# Patient Record
Sex: Male | Born: 1965 | Race: Black or African American | Hispanic: No | Marital: Married | Smoking: Former smoker
Health system: Southern US, Community
[De-identification: ages and names within clinical notes are randomized; demographics above are authoritative.]

## PROBLEM LIST (undated history)

## (undated) DIAGNOSIS — I10 Essential (primary) hypertension: Secondary | ICD-10-CM

## (undated) DIAGNOSIS — R55 Syncope and collapse: Secondary | ICD-10-CM

## (undated) DIAGNOSIS — F419 Anxiety disorder, unspecified: Secondary | ICD-10-CM

## (undated) DIAGNOSIS — E079 Disorder of thyroid, unspecified: Secondary | ICD-10-CM

## (undated) HISTORY — DX: Syncope and collapse: R55

## (undated) HISTORY — PX: NO PAST SURGERIES: SHX2092

---

## 2001-12-04 ENCOUNTER — Encounter: Admission: RE | Admit: 2001-12-04 | Discharge: 2001-12-04 | Payer: Self-pay | Admitting: Family Medicine

## 2003-04-24 ENCOUNTER — Encounter: Admission: RE | Admit: 2003-04-24 | Discharge: 2003-04-24 | Payer: Self-pay | Admitting: Family Medicine

## 2009-11-17 DIAGNOSIS — R55 Syncope and collapse: Secondary | ICD-10-CM

## 2009-11-17 HISTORY — DX: Syncope and collapse: R55

## 2010-01-05 ENCOUNTER — Encounter: Payer: Self-pay | Admitting: Internal Medicine

## 2010-01-05 ENCOUNTER — Ambulatory Visit: Payer: Self-pay | Admitting: Internal Medicine

## 2010-01-05 DIAGNOSIS — R030 Elevated blood-pressure reading, without diagnosis of hypertension: Secondary | ICD-10-CM

## 2010-01-07 LAB — CONVERTED CEMR LAB
ALT: 21 units/L (ref 0–53)
AST: 32 units/L (ref 0–37)
Albumin: 4.3 g/dL (ref 3.5–5.2)
Alkaline Phosphatase: 45 units/L (ref 39–117)
BUN: 11 mg/dL (ref 6–23)
Basophils Absolute: 0 10*3/uL (ref 0.0–0.1)
Basophils Relative: 0.9 % (ref 0.0–3.0)
Bilirubin, Direct: 0.1 mg/dL (ref 0.0–0.3)
CO2: 27 meq/L (ref 19–32)
Calcium: 9.4 mg/dL (ref 8.4–10.5)
Chloride: 102 meq/L (ref 96–112)
Cholesterol: 188 mg/dL (ref 0–200)
Creatinine, Ser: 0.8 mg/dL (ref 0.4–1.5)
Eosinophils Absolute: 0.1 10*3/uL (ref 0.0–0.7)
Eosinophils Relative: 1.4 % (ref 0.0–5.0)
GFR calc non Af Amer: 135.73 mL/min (ref >60.00)
Glucose, Bld: 76 mg/dL (ref 70–99)
HCT: 40.3 % (ref 39.0–52.0)
HDL: 88.6 mg/dL (ref >39.00)
Hemoglobin: 13.8 g/dL (ref 13.0–17.0)
LDL Cholesterol: 83 mg/dL (ref 0–99)
Lymphocytes Relative: 36.9 % (ref 12.0–46.0)
Lymphs Abs: 1.3 10*3/uL (ref 0.7–4.0)
MCHC: 34.2 g/dL (ref 30.0–36.0)
MCV: 86.4 fL (ref 78.0–100.0)
Monocytes Absolute: 0.3 10*3/uL (ref 0.1–1.0)
Monocytes Relative: 9.3 % (ref 3.0–12.0)
Neutro Abs: 1.9 10*3/uL (ref 1.4–7.7)
Neutrophils Relative %: 51.5 % (ref 43.0–77.0)
Platelets: 246 10*3/uL (ref 150.0–400.0)
Potassium: 4.1 meq/L (ref 3.5–5.1)
RBC: 4.67 M/uL (ref 4.22–5.81)
RDW: 13.8 % (ref 11.5–14.6)
Sodium: 138 meq/L (ref 135–145)
TSH: 0.52 microintl units/mL (ref 0.35–5.50)
Total Bilirubin: 0.5 mg/dL (ref 0.3–1.2)
Total CHOL/HDL Ratio: 2
Total Protein: 7.5 g/dL (ref 6.0–8.3)
Triglycerides: 80 mg/dL (ref 0.0–149.0)
VLDL: 16 mg/dL (ref 0.0–40.0)
WBC: 3.6 10*3/uL — ABNORMAL LOW (ref 4.5–10.5)

## 2010-01-14 ENCOUNTER — Ambulatory Visit (HOSPITAL_COMMUNITY): Admission: RE | Admit: 2010-01-14 | Payer: Self-pay | Source: Home / Self Care | Admitting: Internal Medicine

## 2010-01-14 ENCOUNTER — Ambulatory Visit: Payer: Self-pay

## 2010-01-15 ENCOUNTER — Encounter: Payer: Self-pay | Admitting: Internal Medicine

## 2010-01-19 ENCOUNTER — Ambulatory Visit: Admission: RE | Admit: 2010-01-19 | Payer: Self-pay | Source: Home / Self Care | Admitting: Internal Medicine

## 2010-01-19 ENCOUNTER — Encounter: Payer: Self-pay | Admitting: Internal Medicine

## 2010-01-20 ENCOUNTER — Ambulatory Visit (HOSPITAL_COMMUNITY)
Admission: RE | Admit: 2010-01-20 | Discharge: 2010-01-20 | Payer: Self-pay | Source: Home / Self Care | Attending: Internal Medicine | Admitting: Internal Medicine

## 2010-01-20 ENCOUNTER — Other Ambulatory Visit: Payer: Self-pay | Admitting: Internal Medicine

## 2010-01-27 ENCOUNTER — Encounter (INDEPENDENT_AMBULATORY_CARE_PROVIDER_SITE_OTHER): Payer: Self-pay | Admitting: *Deleted

## 2010-02-12 ENCOUNTER — Ambulatory Visit: Admit: 2010-02-12 | Payer: Self-pay | Admitting: Internal Medicine

## 2010-02-18 NOTE — Assessment & Plan Note (Signed)
Summary: bp check///sph    Nurse Visit   Vital Signs:  Patient profile:   45 year old male BP sitting:   142 / 82  (left arm) Cuff size:   large CC: BP check./kb Comments Patient was advised that his BP is better than before, but still not ideal. He is aware I will call him at home with MD recommendations. Lucious Groves CMA  January 19, 2010 9:20 AM    Allergies: No Known Drug Allergies  Impression & Recommendations:  Problem # 1:  ELEVATED BP READING WITHOUT DX HYPERTENSION (ICD-796.2)  advised patient BP today---better I recommend him to continue watching his salt intake, exercise daily. Please come back in 4 weeks for another  nurse visit and BP check  He was recommended an  echocardiogram...Marland KitchenMarland Kitchen. done?   BP today: 142/82 Prior BP: 160/90 (01/05/2010)  Labs Reviewed: Creat: 0.8 (01/05/2010) Chol: 188 (01/05/2010)   HDL: 88.60 (01/05/2010)   LDL: 83 (01/05/2010)   TG: 80.0 (01/05/2010)  Instructed in low sodium diet (DASH Handout) and behavior modification.    Orders: No Charge Patient Arrived (NCPA0) (NCPA0)   Orders Added: 1)  No Charge Patient Arrived (NCPA0) [NCPA0] I left message with family member at patient home to have him call the office. I tried work Chemical engineer notified me that she did not know who the patient was. Lucious Groves CMA  January 20, 2010 11:04 AM   No return call from the patient, tried work # again and they cannot connect me to the patient b/c they do not know who he is nor his department. I spoke with patient spouse and notified her that I cannot get in touch with the patient. She will have him call our office tomorrow. Lucious Groves CMA  January 21, 2010 11:35 AM   Patient notified of the above and states that he had the echo done yesterday. Lucious Groves CMA  January 22, 2010 11:44 AM

## 2010-02-18 NOTE — Assessment & Plan Note (Signed)
Summary: NEW TO EST/KN   Vital Signs:  Patient profile:   45 year old male Height:      68.5 inches Weight:      126 pounds BMI:     18.95 Pulse rate:   85 / minute Pulse rhythm:   regular BP sitting:   160 / 90  (left arm) Cuff size:   regular  Vitals Entered By: Army Fossa CMA (January 05, 2010 9:20 AM) CC: New to establish-CPX- fasting  Comments high blood pressur readings declines Td, declines flu fainted 1 month ago [facility] [address]   History of Present Illness: new patient CPX  --BP elevated a month ago when he had a check up for foster care . No h/o HTN prior to that  --a month ago, was talking with a friend in the parking lot , he suddenly felt weak and almost fainted. His friend helped him into his apartment, symptoms resolved within a minute. this was a normal day, he was not particularly tired, he was not drinking. No actual loss of consciousness There was no associated chest pain, shortness of breath, palpitations, headaches, diplopia or slurred speech. His vision got slightly blurred. this was the first episode he ever had, no further episodes  Preventive Screening-Counseling & Management  Alcohol-Tobacco     Alcohol drinks/day: 2     Alcohol type: beer     Smoking Status: never  Caffeine-Diet-Exercise     Does Patient Exercise: no      Drug Use:  no.    Current Medications (verified): 1)  None  Allergies (verified): No Known Drug Allergies  Past History:  Social History: Last updated: 01/05/2010 Married children x 3 biological, 4 step children occupation [employer] Never Smoked Alcohol use-yes Drug use-no Regular exercise-no  Risk Factors: Alcohol Use: 2 (01/05/2010) Exercise: no (01/05/2010)  Risk Factors: Smoking Status: never (01/05/2010)  Past Medical History: Heart Murmur ?  Past Surgical History: no major surgeries   Family History: colon ca--no breast ca--no prostate ca--no MI--no DM--no  Social  History: Married children x 3 biological, 4 step children occupation [employer] Never Smoked Alcohol use-yes Drug use-no Regular exercise-no Smoking Status:  never Drug Use:  no Does Patient Exercise:  no  Review of Systems General:  Denies fatigue, fever, and weight loss. CV:  Denies swelling of feet. Resp:  Denies cough, shortness of breath, and wheezing. GI:  Denies bloody stools, diarrhea, nausea, and vomiting. GU:  Denies dysuria, hematuria, urinary frequency, and urinary hesitancy. Psych:  Denies anxiety and depression.  Physical Exam  General:  alert, well-developed, and well-nourished.   Neck:  no masses, no thyromegaly, and normal carotid upstroke.   Lungs:  normal respiratory effort, no intercostal retractions, no accessory muscle use, and normal breath sounds.   Heart:  normal rate, regular rhythm, no murmur, and no gallop.   Abdomen:  soft, non-tender, no distention, no masses, no guarding, and no rigidity.   Extremities:  no pretibial edema bilaterally  Psych:  Oriented X3, memory intact for recent and remote, normally interactive, good eye contact, not anxious appearing, and not depressed appearing.     Impression & Recommendations:  Problem # 1:  ROUTINE GENERAL MEDICAL EXAM@HEALTH  CARE FACL (ICD-V70.0) Td-- last?  declined flu shot-- declined  explained the benefits of immunization  labs Diet and exercise discussed  Orders: Venipuncture (14782) TLB-BMP (Basic Metabolic Panel-BMET) (80048-METABOL) TLB-CBC Platelet - w/Differential (85025-CBCD) TLB-Hepatic/Liver Function Pnl (80076-HEPATIC) TLB-Lipid Panel (80061-LIPID) TLB-TSH (Thyroid Stimulating Hormone) (84443-TSH)  Specimen Handling (04540)  Problem # 2:  ELEVATED BP READING WITHOUT DX HYPERTENSION (ICD-796.2) no history of hypertension BP was elevated a month ago and today Plan: low salt diet, return to the office in 2 weeks for BP check, no charge  if the BP is still elevated will start  medication.  BP today: 160/90     Problem # 3:  ? of SYNCOPE (ICD-780.2) EKG normal sinus rhythm Physical exam normal, no associated symptoms with syncope. plan---Echocardiogram if the echocardiogram is normal we'll prescribe observation  patient knows to call me if he has another spell.  Orders: EKG w/ Interpretation (93000) Cardiology Referral (Cardiology)  Patient Instructions: 1)  low salt diet 2)  Come back in 2 weeks for a BP check   Orders Added: 1)  Venipuncture [36415] 2)  TLB-BMP (Basic Metabolic Panel-BMET) [80048-METABOL] 3)  TLB-CBC Platelet - w/Differential [85025-CBCD] 4)  TLB-Hepatic/Liver Function Pnl [80076-HEPATIC] 5)  TLB-Lipid Panel [80061-LIPID] 6)  TLB-TSH (Thyroid Stimulating Hormone) [84443-TSH] 7)  EKG w/ Interpretation [93000] 8)  Specimen Handling [99000] 9)  New Patient Level III [98119] 10)  Cardiology Referral [Cardiology] 11)  New Patient 40-64 years [99386]     Risk Factors:  Tobacco use:  never Drug use:  no Alcohol use:  yes    Type:  beer    Drinks per day:  2 Exercise:  no

## 2010-02-18 NOTE — Letter (Signed)
Summary: Unable To Reach-Consult Scheduled  [facility]  [address] [place], [place] [address]   Phone: [phone]  Fax: [phone]    01/27/2010 MRN: [id number]    Dear Hunter Mitchell,   We have been unable to reach you by phone.  Please contact our office with an updated phone number. Enclosed is a copy of your recent ECHO.    Thank you,  Army Fossa CMA  January 27, 2010 2:33 PM

## 2010-02-22 DIAGNOSIS — Z0279 Encounter for issue of other medical certificate: Secondary | ICD-10-CM

## 2010-03-10 NOTE — Letter (Signed)
Summary: Medical Evaluation for [facility]  Medical Evaluation for [facility]   Imported By: Maryln Gottron 03/02/2010 12:46:24  _____________________________________________________________________  External Attachment:    Type:   Image     Comment:   External Document

## 2010-08-04 ENCOUNTER — Encounter: Payer: Self-pay | Admitting: Internal Medicine

## 2010-08-05 ENCOUNTER — Encounter: Payer: Self-pay | Admitting: Internal Medicine

## 2010-08-05 ENCOUNTER — Ambulatory Visit (INDEPENDENT_AMBULATORY_CARE_PROVIDER_SITE_OTHER): Payer: Managed Care, Other (non HMO) | Admitting: Internal Medicine

## 2010-08-05 DIAGNOSIS — R5383 Other fatigue: Secondary | ICD-10-CM | POA: Insufficient documentation

## 2010-08-05 DIAGNOSIS — R6882 Decreased libido: Secondary | ICD-10-CM

## 2010-08-05 DIAGNOSIS — R5381 Other malaise: Secondary | ICD-10-CM

## 2010-08-05 LAB — CBC WITH DIFFERENTIAL/PLATELET
Basophils Absolute: 0 10*3/uL (ref 0.0–0.1)
Basophils Relative: 0.7 % (ref 0.0–3.0)
Eosinophils Absolute: 0.1 10*3/uL (ref 0.0–0.7)
Eosinophils Relative: 2.2 % (ref 0.0–5.0)
HCT: 42.3 % (ref 39.0–52.0)
Hemoglobin: 14.2 g/dL (ref 13.0–17.0)
Lymphocytes Relative: 39.4 % (ref 12.0–46.0)
Lymphs Abs: 1.3 10*3/uL (ref 0.7–4.0)
MCHC: 33.7 g/dL (ref 30.0–36.0)
MCV: 87.1 fl (ref 78.0–100.0)
Monocytes Absolute: 0.4 10*3/uL (ref 0.1–1.0)
Monocytes Relative: 13.4 % — ABNORMAL HIGH (ref 3.0–12.0)
Neutro Abs: 1.4 10*3/uL (ref 1.4–7.7)
Neutrophils Relative %: 44.3 % (ref 43.0–77.0)
Platelets: 220 10*3/uL (ref 150.0–400.0)
RBC: 4.86 Mil/uL (ref 4.22–5.81)
RDW: 13.8 % (ref 11.5–14.6)
WBC: 3.3 10*3/uL — ABNORMAL LOW (ref 4.5–10.5)

## 2010-08-05 LAB — ALT: ALT: 19 U/L (ref 0–53)

## 2010-08-05 LAB — TSH: TSH: 0.23 u[IU]/mL — ABNORMAL LOW (ref 0.35–5.50)

## 2010-08-05 LAB — AST: AST: 30 U/L (ref 0–37)

## 2010-08-05 NOTE — Assessment & Plan Note (Signed)
See above, check a testosterone level

## 2010-08-05 NOTE — Assessment & Plan Note (Addendum)
1 month h/o decreased energy, decreased libido, some ED ROS essentially neg, physical exam wnl, ECHO 6 months ago normal. Plan: Labs, reasses in 4 weeks May need to be checked for OSA although he does not fit the typical OSA profile

## 2010-08-05 NOTE — Progress Notes (Signed)
  Subjective:    Patient ID: Hunter Mitchell, male    DOB: 18-Oct-1965, 45 y.o.   MRN: [id number]  HPI  One month history of decreased energy described as generalized fatigue along with decreased sex drive. In a couple of occasions he has noted difficulty with erections. Used to work 2 jobs up to a couple of years ago and felt great and now he is tired despite the fact that he is working one job.  Past Medical History  Diagnosis Date  . Syncope 11-2009    ECHO 01-2010 normal   No past surgical history on file.   Family History: colon ca--no breast ca--no prostate ca--no MI--no DM--no  Social History: Married children x 3 biological, 4 step children occupation [employer] Never Smoked Alcohol use-yes Drug use-no Regular exercise-no       Review of Systems Denies chest pain, shortness of breath, dyspnea on exertion or extremity edema. No recent weight loss or headaches No nausea, vomiting, diarrhea or blood in the tools Denies depression. He snores occasionally and rarely feels sleepy during the daytime. Denies any hot flashes testicular problems.    Objective:   Physical Exam  Constitutional: He appears well-developed. No distress.       slt underweight , BMI 19  HENT:  Head: Normocephalic and atraumatic.  Neck: No thyromegaly present.  Cardiovascular: Normal rate, regular rhythm and normal heart sounds.   No murmur heard. Pulmonary/Chest: Effort normal and breath sounds normal. No respiratory distress. He has no wheezes. He has no rales.  Abdominal: Soft. Bowel sounds are normal. He exhibits no distension. There is no tenderness. There is no rebound and no guarding.  Musculoskeletal: He exhibits no edema.  Neurological: He is alert.  Skin: Skin is warm and dry.  Psychiatric: He has a normal mood and affect. His behavior is normal. Judgment and thought content normal.          Assessment & Plan:

## 2010-08-06 LAB — TESTOSTERONE, FREE, TOTAL, SHBG
Sex Hormone Binding: 32 nmol/L (ref 13–71)
Testosterone, Free: 119.1 pg/mL (ref 47.0–244.0)
Testosterone-% Free: 2.2 % (ref 1.6–2.9)
Testosterone: 549.22 ng/dL (ref 250–890)

## 2010-08-10 ENCOUNTER — Telehealth: Payer: Self-pay | Admitting: *Deleted

## 2010-08-10 NOTE — Telephone Encounter (Signed)
Message left for patient to return my call.  

## 2010-08-10 NOTE — Telephone Encounter (Signed)
Message copied by Leanne Lovely on Tue Aug 10, 2010  1:15 PM ------      Message from: Willow Ora E      Created: Mon Aug 09, 2010  6:06 PM       Advise patient      CBC normal, no anemia.      Testosterone level normal.      Thyroid gland may be slightly over-working: I am planning to recheck his TSH, T3 and T4 when he comes back in one month.

## 2010-08-11 NOTE — Telephone Encounter (Signed)
Message left for patient to return my call.  

## 2010-08-12 NOTE — Telephone Encounter (Signed)
Message left for patient to return my call.  

## 2010-08-13 ENCOUNTER — Encounter: Payer: Self-pay | Admitting: *Deleted

## 2010-08-13 NOTE — Telephone Encounter (Signed)
Will mail pt labs and letter.

## 2010-09-29 ENCOUNTER — Telehealth: Payer: Self-pay | Admitting: Internal Medicine

## 2010-09-29 DIAGNOSIS — E0590 Thyrotoxicosis, unspecified without thyrotoxic crisis or storm: Secondary | ICD-10-CM

## 2010-09-29 NOTE — Telephone Encounter (Signed)
Advise patient: Needs: TSH, free T3 and free T4 --dx hyperthyroidism

## 2010-09-30 NOTE — Telephone Encounter (Signed)
Left message for pt to call office to schedule lab visit. Orders placed

## 2013-02-04 ENCOUNTER — Encounter: Payer: Self-pay | Admitting: Internal Medicine

## 2013-02-12 ENCOUNTER — Telehealth: Payer: Self-pay

## 2013-02-12 NOTE — Telephone Encounter (Signed)
Patient was cancelled due to provider

## 2013-02-13 ENCOUNTER — Encounter: Payer: Managed Care, Other (non HMO) | Admitting: Internal Medicine

## 2013-03-06 ENCOUNTER — Encounter: Payer: Managed Care, Other (non HMO) | Admitting: Internal Medicine

## 2013-04-11 ENCOUNTER — Encounter: Payer: Managed Care, Other (non HMO) | Admitting: Internal Medicine

## 2013-06-16 ENCOUNTER — Encounter (HOSPITAL_COMMUNITY): Payer: Self-pay | Admitting: Emergency Medicine

## 2013-06-16 ENCOUNTER — Emergency Department (HOSPITAL_COMMUNITY)
Admission: EM | Admit: 2013-06-16 | Discharge: 2013-06-16 | Disposition: A | Discharge status: Home / Self Care | Payer: Managed Care, Other (non HMO) | Attending: Emergency Medicine | Admitting: Emergency Medicine

## 2013-06-16 DIAGNOSIS — S0180XA Unspecified open wound of other part of head, initial encounter: Secondary | ICD-10-CM | POA: Insufficient documentation

## 2013-06-16 DIAGNOSIS — S01501A Unspecified open wound of lip, initial encounter: Secondary | ICD-10-CM | POA: Insufficient documentation

## 2013-06-16 DIAGNOSIS — S0181XA Laceration without foreign body of other part of head, initial encounter: Secondary | ICD-10-CM

## 2013-06-16 MED ORDER — LIDOCAINE-EPINEPHRINE-TETRACAINE (LET) SOLUTION
3.0000 mL | Freq: Once | NASAL | Status: AC
  Administered 2013-06-16: 3 mL via TOPICAL
  Filled 2013-06-16: qty 3

## 2013-06-16 MED ORDER — TRAMADOL HCL 50 MG PO TABS: 50.0000 mg | ORAL_TABLET | Freq: Four times a day (QID) | ORAL | Status: DC | PRN

## 2013-06-16 MED ORDER — LORAZEPAM 1 MG PO TABS
1.0000 mg | ORAL_TABLET | Freq: Once | ORAL | Status: AC
  Administered 2013-06-16: 1 mg via ORAL
  Filled 2013-06-16: qty 2

## 2013-06-16 NOTE — Discharge Instructions (Signed)
Please follow up with your primary care physician in 1-2 days. If you do not have one please call [facility] number listed above. Please take care her of your sutures. They are dissolvable and will resolve once the wound is healed. Area clean, dry, covered if at all possible. Please avoid shaving your face in that area. Please read all discharge instructions and return precautions.    Facial Laceration  A facial laceration is a cut on the face. These injuries can be painful and cause bleeding. Lacerations usually heal quickly, but they need special care to reduce scarring. DIAGNOSIS  Your health care provider will take a medical history, ask for details about how the injury occurred, and examine the wound to determine how deep the cut is. TREATMENT  Some facial lacerations may not require closure. Others may not be able to be closed because of an increased risk of infection. The risk of infection and the chance for successful closure will depend on various factors, including the amount of time since the injury occurred. The wound may be cleaned to help prevent infection. If closure is appropriate, pain medicines may be given if needed. Your health care provider will use stitches (sutures), wound glue (adhesive), or skin adhesive strips to repair the laceration. These tools bring the skin edges together to allow for faster healing and a better cosmetic outcome. If needed, you may also be given a tetanus shot. HOME CARE INSTRUCTIONS  Only take over-the-counter or prescription medicines as directed by your health care provider.  Follow your health care provider's instructions for wound care. These instructions will vary depending on the technique used for closing the wound. For Sutures:  Keep the wound clean and dry.   If you were given a bandage (dressing), you should change it at least once a day. Also change the dressing if it becomes wet or dirty, or as directed by your  health care provider.   Wash the wound with soap and water 2 times a day. Rinse the wound off with water to remove all soap. Pat the wound dry with a clean towel.   After cleaning, apply a thin layer of the antibiotic ointment recommended by your health care provider. This will help prevent infection and keep the dressing from sticking.   You may shower as usual after the first 24 hours. Do not soak the wound in water until the sutures are removed.   Get your sutures removed as directed by your health care provider. With facial lacerations, sutures should usually be taken out after 4 5 days to avoid stitch marks.   Wait a few days after your sutures are removed before applying any makeup. For Skin Adhesive Strips:  Keep the wound clean and dry.   Do not get the skin adhesive strips wet. You may bathe carefully, using caution to keep the wound dry.   If the wound gets wet, pat it dry with a clean towel.   Skin adhesive strips will fall off on their own. You may trim the strips as the wound heals. Do not remove skin adhesive strips that are still stuck to the wound. They will fall off in time.  For Wound Adhesive:  You may briefly wet your wound in the shower or bath. Do not soak or scrub the wound. Do not swim. Avoid periods of heavy sweating until the skin adhesive has fallen off on its own. After showering or bathing, gently pat the wound dry with  a clean towel.   Do not apply liquid medicine, cream medicine, ointment medicine, or makeup to your wound while the skin adhesive is in place. This may loosen the film before your wound is healed.   If a dressing is placed over the wound, be careful not to apply tape directly over the skin adhesive. This may cause the adhesive to be pulled off before the wound is healed.   Avoid prolonged exposure to sunlight or tanning lamps while the skin adhesive is in place.  The skin adhesive will usually remain in place for 5 10 days, then  naturally fall off the skin. Do not pick at the adhesive film.  After Healing: Once the wound has healed, cover the wound with sunscreen during the day for 1 full year. This can help minimize scarring. Exposure to ultraviolet light in the first year will darken the scar. It can take 1 2 years for the scar to lose its redness and to heal completely.  SEEK IMMEDIATE MEDICAL CARE IF:  You have redness, pain, or swelling around the wound.   You see ayellowish-white fluid (pus) coming from the wound.   You have chills or a fever.  MAKE SURE YOU:  Understand these instructions.  Will watch your condition.  Will get help right away if you are not doing well or get worse. Document Released: 02/11/2004 Document Revised: 10/24/2012 Document Reviewed: 08/16/2012 [facility] Patient Information 2014 [place], [place]. Laceration Care, Adult A laceration is a cut or lesion that goes through all layers of the skin and into the tissue just beneath the skin. TREATMENT  Some lacerations may not require closure. Some lacerations may not be able to be closed due to an increased risk of infection. It is important to see your caregiver as soon as possible after an injury to minimize the risk of infection and maximize the opportunity for successful closure. If closure is appropriate, pain medicines may be given, if needed. The wound will be cleaned to help prevent infection. Your caregiver will use stitches (sutures), staples, wound glue (adhesive), or skin adhesive strips to repair the laceration. These tools bring the skin edges together to allow for faster healing and a better cosmetic outcome. However, all wounds will heal with a scar. Once the wound has healed, scarring can be minimized by covering the wound with sunscreen during the day for 1 full year. HOME CARE INSTRUCTIONS  For sutures or staples:  Keep the wound clean and dry.  If you were given a bandage (dressing), you should change it at least  once a day. Also, change the dressing if it becomes wet or dirty, or as directed by your caregiver.  Wash the wound with soap and water 2 times a day. Rinse the wound off with water to remove all soap. Pat the wound dry with a clean towel.  After cleaning, apply a thin layer of the antibiotic ointment as recommended by your caregiver. This will help prevent infection and keep the dressing from sticking.  You may shower as usual after the first 24 hours. Do not soak the wound in water until the sutures are removed.  Only take over-the-counter or prescription medicines for pain, discomfort, or fever as directed by your caregiver.  Get your sutures or staples removed as directed by your caregiver. For skin adhesive strips:  Keep the wound clean and dry.  Do not get the skin adhesive strips wet. You may bathe carefully, using caution to keep the wound dry.  If  the wound gets wet, pat it dry with a clean towel.  Skin adhesive strips will fall off on their own. You may trim the strips as the wound heals. Do not remove skin adhesive strips that are still stuck to the wound. They will fall off in time. For wound adhesive:  You may briefly wet your wound in the shower or bath. Do not soak or scrub the wound. Do not swim. Avoid periods of heavy perspiration until the skin adhesive has fallen off on its own. After showering or bathing, gently pat the wound dry with a clean towel.  Do not apply liquid medicine, cream medicine, or ointment medicine to your wound while the skin adhesive is in place. This may loosen the film before your wound is healed.  If a dressing is placed over the wound, be careful not to apply tape directly over the skin adhesive. This may cause the adhesive to be pulled off before the wound is healed.  Avoid prolonged exposure to sunlight or tanning lamps while the skin adhesive is in place. Exposure to ultraviolet light in the first year will darken the scar.  The skin  adhesive will usually remain in place for 5 to 10 days, then naturally fall off the skin. Do not pick at the adhesive film. You may need a tetanus shot if:  You cannot remember when you had your last tetanus shot.  You have never had a tetanus shot. If you get a tetanus shot, your arm may swell, get red, and feel warm to the touch. This is common and not a problem. If you need a tetanus shot and you choose not to have one, there is a rare chance of getting tetanus. Sickness from tetanus can be serious. SEEK MEDICAL CARE IF:   You have redness, swelling, or increasing pain in the wound.  You see a red line that goes away from the wound.  You have yellowish-white fluid (pus) coming from the wound.  You have a fever.  You notice a bad smell coming from the wound or dressing.  Your wound breaks open before or after sutures have been removed.  You notice something coming out of the wound such as wood or glass.  Your wound is on your hand or foot and you cannot move a finger or toe. SEEK IMMEDIATE MEDICAL CARE IF:   Your pain is not controlled with prescribed medicine.  You have severe swelling around the wound causing pain and numbness or a change in color in your arm, hand, leg, or foot.  Your wound splits open and starts bleeding.  You have worsening numbness, weakness, or loss of function of any joint around or beyond the wound.  You develop painful lumps near the wound or on the skin anywhere on your body. MAKE SURE YOU:   Understand these instructions.  Will watch your condition.  Will get help right away if you are not doing well or get worse. Document Released: 01/03/2005 Document Revised: 03/28/2011 Document Reviewed: 06/29/2010 [facility] Patient Information 2014 [place], [place].

## 2013-06-16 NOTE — ED Provider Notes (Signed)
Medical screening examination/treatment/procedure(s) were performed by non-physician practitioner and as supervising physician I was immediately available for consultation/collaboration.   EKG Interpretation None       Shon Baton, MD 06/16/13 2035
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
## 2013-11-22 ENCOUNTER — Ambulatory Visit (INDEPENDENT_AMBULATORY_CARE_PROVIDER_SITE_OTHER): Payer: Managed Care, Other (non HMO) | Admitting: Internal Medicine

## 2013-11-22 ENCOUNTER — Encounter: Payer: Self-pay | Admitting: Internal Medicine

## 2013-11-22 DIAGNOSIS — Z0000 Encounter for general adult medical examination without abnormal findings: Secondary | ICD-10-CM

## 2013-11-22 DIAGNOSIS — R030 Elevated blood-pressure reading, without diagnosis of hypertension: Secondary | ICD-10-CM

## 2013-11-22 NOTE — Progress Notes (Signed)
   Subjective:    Patient ID: Stark KleinHenry Amezcua, male    DOB: Feb 08, 1965, 48 y.o.   MRN: [id number]  DOS:  11/22/2013 Type of visit - description : new pt, CPX Interval history: needs a CPX, no concerns   ROS Diet-- no salt diet Exercise-- active at work (heavy physical work) No  CP, SOB No palpitations, no lower extremity edema Denies  nausea, vomiting diarrhea, blood in the stools (-) cough, sputum production (-) wheezing, chest congestion No dysuria, gross hematuria, difficulty urinating  No anxiety, depression      Past Medical History  Diagnosis Date  . Syncope 11-2009    ECHO 01-2010 normal    Past Surgical History  Procedure Laterality Date  . No past surgeries      History   Social History  . Marital Status: Married    Spouse Name: N/A    Number of Children: 3  . Years of Education: N/A   Occupational History  . [employer]    Social History Main Topics  . Smoking status: Never Smoker   . Smokeless tobacco: Never Used  . Alcohol Use: 0.0 oz/week    0 Not specified per week     Comment: socially   . Drug Use: No  . Sexual Activity: Not on file   Other Topics Concern  . Not on file   Social History Narrative   Children x 3,biological, 4 step children           Family History  Problem Relation Age of Onset  . Colon cancer Neg Hx   . Breast cancer Neg Hx   . Prostate cancer Neg Hx   . Heart attack Neg Hx   . Diabetes Neg Hx        Medication List       This list is accurate as of: 11/22/13  7:07 PM.  Always use your most recent med list.               traMADol 50 MG tablet  Commonly known as:  ULTRAM  Take 1 tablet (50 mg total) by mouth every 6 (six) hours as needed.           Objective:   Physical Exam BP 146/87 mmHg  Pulse 82  Temp(Src) 98.6 F (37 C) (Oral)  Ht 5\' 8"  (1.727 m)  Wt 120 lb 2 oz (54.488 kg)  BMI 18.27 kg/m2  SpO2 99% General -- alert, well-developed, NAD.  Neck --no thyromegaly , normal carotid  pulse HEENT-- Not pale.  Lungs -- normal respiratory effort, no intercostal retractions, no accessory muscle use, and normal breath sounds.  Heart-- normal rate, regular rhythm, no murmur.  Abdomen-- Not distended, good bowel sounds,soft, non-tender. No rebound or rigidity. No mass,organomegaly. Rectal-- No external abnormalities noted. Normal sphincter tone. No rectal masses or tenderness. No stools found  Prostate--Prostate gland firm and smooth, no enlargement, nodularity, tenderness, mass, asymmetry or induration. Extremities-- no pretibial edema bilaterally  Neurologic--  alert & oriented X3. Speech normal, gait appropriate for age, strength symmetric and appropriate for age.  Psych-- Cognition and judgment appear intact. Cooperative with normal attention span and concentration. No anxious or depressed appearing.        Assessment & Plan:

## 2013-11-22 NOTE — Assessment & Plan Note (Addendum)
Td within 10 years per pt Declined a flu shot  BMI low but he has been underwt for long time. He appears healthy. The patient request a prostate and colon cancer screening, he is not 52 but he is African-American consequently I agree to do the screenings every 2 years until age 48. DRE negative, I FOB provided, check a PSA along w/ other labs Continue with his healthy lifestyle.

## 2013-11-22 NOTE — Progress Notes (Signed)
Pre visit review using our clinic review tool, if applicable. No additional management support is needed unless otherwise documented below in the visit note. 

## 2013-11-22 NOTE — Patient Instructions (Signed)
Stop by the front desk and schedule labs to be done within few days (fasting)   Check the  blood pressure 2 or 3 times a month    Be sure your blood pressure is between  145/85  and 110/65.  if it is consistently higher or lower, let me know     Please come back to the office in 1 year  for a physical exam. Come back fasting

## 2013-11-22 NOTE — Assessment & Plan Note (Signed)
H/o slightly elevated BP before, recommend to monitor BPs, continue with low-salt diet

## 2013-12-06 ENCOUNTER — Other Ambulatory Visit (INDEPENDENT_AMBULATORY_CARE_PROVIDER_SITE_OTHER): Payer: Managed Care, Other (non HMO)

## 2013-12-06 DIAGNOSIS — Z0000 Encounter for general adult medical examination without abnormal findings: Secondary | ICD-10-CM

## 2013-12-06 DIAGNOSIS — E0590 Thyrotoxicosis, unspecified without thyrotoxic crisis or storm: Secondary | ICD-10-CM

## 2013-12-06 LAB — COMPREHENSIVE METABOLIC PANEL
ALT: 28 U/L (ref 0–53)
AST: 43 U/L — ABNORMAL HIGH (ref 0–37)
Albumin: 4.2 g/dL (ref 3.5–5.2)
Alkaline Phosphatase: 53 U/L (ref 39–117)
BUN: 12 mg/dL (ref 6–23)
CALCIUM: 9.5 mg/dL (ref 8.4–10.5)
CO2: 26 mEq/L (ref 19–32)
Chloride: 104 mEq/L (ref 96–112)
Creatinine, Ser: 0.8 mg/dL (ref 0.4–1.5)
GFR: 127.79 mL/min (ref >60.00)
GLUCOSE: 96 mg/dL (ref 70–99)
POTASSIUM: 3.4 meq/L — AB (ref 3.5–5.1)
Sodium: 140 mEq/L (ref 135–145)
TOTAL PROTEIN: 7.5 g/dL (ref 6.0–8.3)
Total Bilirubin: 0.6 mg/dL (ref 0.2–1.2)

## 2013-12-06 LAB — CBC WITH DIFFERENTIAL/PLATELET
BASOS ABS: 0 10*3/uL (ref 0.0–0.1)
BASOS PCT: 0 % (ref 0.0–3.0)
EOS ABS: 0.1 10*3/uL (ref 0.0–0.7)
Eosinophils Relative: 1.3 % (ref 0.0–5.0)
HCT: 39.7 % (ref 39.0–52.0)
Hemoglobin: 13.4 g/dL (ref 13.0–17.0)
LYMPHS PCT: 31 % (ref 12.0–46.0)
Lymphs Abs: 1.2 10*3/uL (ref 0.7–4.0)
MCHC: 33.7 g/dL (ref 30.0–36.0)
MCV: 86.2 fl (ref 78.0–100.0)
Monocytes Absolute: 0.6 10*3/uL (ref 0.1–1.0)
Monocytes Relative: 14 % — ABNORMAL HIGH (ref 3.0–12.0)
Neutro Abs: 2.1 10*3/uL (ref 1.4–7.7)
Neutrophils Relative %: 53.7 % (ref 43.0–77.0)
PLATELETS: 191 10*3/uL (ref 150.0–400.0)
RBC: 4.61 Mil/uL (ref 4.22–5.81)
RDW: 14.3 % (ref 11.5–15.5)
WBC: 3.9 10*3/uL — ABNORMAL LOW (ref 4.0–10.5)

## 2013-12-06 LAB — LIPID PANEL
CHOLESTEROL: 230 mg/dL — AB (ref 0–200)
HDL: 152 mg/dL (ref >39.00)
LDL CALC: 70 mg/dL (ref 0–99)
NonHDL: 78
Total CHOL/HDL Ratio: 2
Triglycerides: 40 mg/dL (ref 0.0–149.0)
VLDL: 8 mg/dL (ref 0.0–40.0)

## 2013-12-06 LAB — PSA: PSA: 1.27 ng/mL (ref 0.10–4.00)

## 2013-12-06 LAB — TSH: TSH: 0.24 u[IU]/mL — AB (ref 0.35–4.50)

## 2013-12-23 ENCOUNTER — Other Ambulatory Visit (INDEPENDENT_AMBULATORY_CARE_PROVIDER_SITE_OTHER): Payer: Managed Care, Other (non HMO)

## 2013-12-23 DIAGNOSIS — Z0000 Encounter for general adult medical examination without abnormal findings: Secondary | ICD-10-CM

## 2013-12-23 LAB — FECAL OCCULT BLOOD, IMMUNOCHEMICAL: FECAL OCCULT BLD: NEGATIVE

## 2014-01-14 ENCOUNTER — Telehealth: Payer: Self-pay

## 2014-01-14 NOTE — Telephone Encounter (Signed)
Letter printed and mailed to Pt.  

## 2014-01-14 NOTE — Telephone Encounter (Signed)
-----   Message from Wanda PlumpJose E Paz, MD sent at 01/13/2014  7:07 PM EST ----- Regarding: print and mail a letter  Please send a letter Hunter [name],  This is a reminder, your thyroid may be overworking and we need further tests. Please call at your earliest convenience and arrange for further labs

## 2014-10-14 ENCOUNTER — Other Ambulatory Visit (INDEPENDENT_AMBULATORY_CARE_PROVIDER_SITE_OTHER): Payer: Managed Care, Other (non HMO)

## 2014-10-14 DIAGNOSIS — R7989 Other specified abnormal findings of blood chemistry: Secondary | ICD-10-CM

## 2014-10-14 DIAGNOSIS — E0590 Thyrotoxicosis, unspecified without thyrotoxic crisis or storm: Secondary | ICD-10-CM

## 2014-10-14 LAB — T4, FREE: FREE T4: 0.58 ng/dL — AB (ref 0.60–1.60)

## 2014-10-14 LAB — T3, FREE: T3 FREE: 3.1 pg/mL (ref 2.3–4.2)

## 2014-10-20 NOTE — Addendum Note (Signed)
Addended by: Dorette Grate on: 10/20/2014 03:02 PM   Modules accepted: Orders

## 2014-11-27 ENCOUNTER — Telehealth: Payer: Self-pay | Admitting: Internal Medicine

## 2014-11-27 DIAGNOSIS — R7989 Other specified abnormal findings of blood chemistry: Secondary | ICD-10-CM

## 2014-11-27 NOTE — Telephone Encounter (Signed)
Pt due for CPE. Please have him schedule at his convenience. Okay to put 2-15 minute appts together if needed.

## 2014-11-27 NOTE — Telephone Encounter (Signed)
Left msg that endo referral is in and for pt to call and schedule CPE (ok to combine 2 15-min slots - see below)

## 2014-11-27 NOTE — Telephone Encounter (Signed)
Pt states that he needs new referral to [facility]. They told him he didn't respond and the referral was cancelled.

## 2014-11-27 NOTE — Telephone Encounter (Signed)
Referral placed.

## 2014-12-04 ENCOUNTER — Ambulatory Visit (INDEPENDENT_AMBULATORY_CARE_PROVIDER_SITE_OTHER): Payer: Managed Care, Other (non HMO) | Admitting: Endocrinology

## 2014-12-04 ENCOUNTER — Encounter: Payer: Self-pay | Admitting: Endocrinology

## 2014-12-04 VITALS — BP 134/87 | HR 65 | Temp 98.6°F | Ht 68.0 in | Wt 121.0 lb

## 2014-12-04 DIAGNOSIS — E0590 Thyrotoxicosis, unspecified without thyrotoxic crisis or storm: Secondary | ICD-10-CM | POA: Diagnosis not present

## 2014-12-04 LAB — TSH: TSH: 0.64 u[IU]/mL (ref 0.35–4.50)

## 2014-12-04 LAB — T4, FREE: FREE T4: 0.82 ng/dL (ref 0.60–1.60)

## 2014-12-04 NOTE — Patient Instructions (Addendum)
blood tests are requested for you today.  We'll let you know about the results. If it is overactive again, i'll prescribe for you a medication to slow the thyroid. if ever you have fever while taking methimazole, stop it and call us, even if the reason is obvious, because of the risk of a rare side-effect. Please come back for a follow-up appointment in 1 month.        Hyperthyroidism Hyperthyroidism is when the thyroid is too active (overactive). Your thyroid is a large gland that is located in your neck. The thyroid helps to control how your body uses food (metabolism). When your thyroid is overactive, it produces too much of a hormone called thyroxine.  CAUSES Causes of hyperthyroidism may include:  Graves disease. This is when your immune system attacks the thyroid gland. This is the most common cause.  Inflammation of the thyroid gland.  Tumor in the thyroid gland or somewhere else.  Excessive use of thyroid medicines, including:  Prescription thyroid supplement.  Herbal supplements that mimic thyroid hormones.  Solid or fluid-filled lumps within your thyroid gland (thyroid nodules).  Excessive ingestion of iodine. RISK FACTORS  Being male.  Having a family history of thyroid conditions. SIGNS AND SYMPTOMS Signs and symptoms of hyperthyroidism may include:  Nervousness.  Inability to tolerate heat.  Unexplained weight loss.  Diarrhea.  Change in the texture of hair or skin.  Heart skipping beats or making extra beats.  Rapid heart rate.  Loss of menstruation.  Shaky hands.  Fatigue.  Restlessness.  Increased appetite.  Sleep problems.  Enlarged thyroid gland or nodules. DIAGNOSIS  Diagnosis of hyperthyroidism may include:  Medical history and physical exam.  Blood tests.  Ultrasound tests. TREATMENT Treatment may include:  Medicines to control your thyroid.  Surgery to remove your thyroid.  Radiation therapy. HOME CARE  INSTRUCTIONS   Take medicines only as directed by your health care provider.  Do not use any tobacco products, including cigarettes, chewing tobacco, or electronic cigarettes. If you need help quitting, ask your health care provider.  Do not exercise or do physical activity until your health care provider approves.  Keep all follow-up appointments as directed by your health care provider. This is important. SEEK MEDICAL CARE IF:  Your symptoms do not get better with treatment.  You have fever.  You are taking thyroid replacement medicine and you:  Have depression.  Feel mentally and physically slow.  Have weight gain. SEEK IMMEDIATE MEDICAL CARE IF:   You have decreased alertness or a change in your awareness.  You have abdominal pain.  You feel dizzy.  You have a rapid heartbeat.  You have an irregular heartbeat.   This information is not intended to replace advice given to you by your health care provider. Make sure you discuss any questions you have with your health care provider.   Document Released: 01/03/2005 Document Revised: 01/24/2014 Document Reviewed: 05/21/2013 Elsevier Interactive Patient Education [employer]2016 Elsevier Inc.

## 2014-12-04 NOTE — Progress Notes (Signed)
Subjective:    Patient ID: Hunter Mitchell, male    DOB: [birth year], 49 y.o.   MRN: [id number]  HPI Pt reports he was found to have a slightly suppressed TSH in 2012.  He has never been on therapy for this.  He has never had XRT to the anterior neck, or thyroid surgery.  He has never had thyroid imaging.  He does not consume kelp or any other non-prescribed thyroid medication.  He has never been on amiodarone.   He has slight tremor of the hands, and assoc difficulty with concentration. Past Medical History  Diagnosis Date  . Syncope 11-2009    ECHO 01-2010 normal    Past Surgical History  Procedure Laterality Date  . No past surgeries      Social History   Social History  . Marital Status: Married    Spouse Name: N/A  . Number of Children: 3  . Years of Education: N/A   Occupational History  . [employer]    Social History Main Topics  . Smoking status: Never Smoker   . Smokeless tobacco: Never Used  . Alcohol Use: 0.0 oz/week    0 Standard drinks or equivalent per week     Comment: socially   . Drug Use: No  . Sexual Activity: Not on file   Other Topics Concern  . Not on file   Social History Narrative   Children x 3,biological, 4 step children          No current outpatient prescriptions on file prior to visit.   No current facility-administered medications on file prior to visit.    No Known Allergies  Family History  Problem Relation Age of Onset  . Colon cancer Neg Hx   . Breast cancer Neg Hx   . Prostate cancer Neg Hx   . Heart attack Neg Hx   . Diabetes Neg Hx   . Thyroid disease Neg Hx     BP 134/87 mmHg  Pulse 65  Temp(Src) 98.6 F (37 C) (Oral)  Ht  (1.727 m)  Wt 121 lb (54.885 kg)  BMI 18.40 kg/m2  SpO2 96%     Review of Systems denies weight loss, headache, hoarseness, visual loss, palpitations, sob, diarrhea, polyuria, muscle weakness, excessive diaphoresis, numbness, anxiety, heat intolerance, easy bruising, and  rhinorrhea.      Objective:   Physical Exam VS: see vs page GEN: no distress HEAD: head: no deformity eyes: no periorbital swelling, no proptosis external nose and ears are normal mouth: no lesion seen NECK: thyroid is slight enlarged, with an irregular surface, but I cannot appreciate any nodule CHEST WALL: no deformity LUNGS: clear to auscultation BREASTS:  No gynecomastia CV: reg rate and rhythm, no murmur ABD: abdomen is soft, nontender.  no hepatosplenomegaly.  not distended.  no hernia.   MUSCULOSKELETAL: muscle bulk and strength are grossly normal.  no obvious joint swelling.  gait is normal and steady EXTEMITIES: no deformity.  no edema PULSES: no carotid bruit NEURO:  cn 2-12 grossly intact.   readily moves all 4's.  sensation is intact to touch on all 4's.  No tremor SKIN:  Normal texture and temperature.  No rash or suspicious lesion is visible.   NODES:  None palpable at the neck.  PSYCH: alert, well-oriented.  Does not appear anxious nor depressed.   Lab Results  Component Value Date   TSH 0.64 12/04/2014   I have reviewed outside records, and summarized: Pt was noted to  have abnormal TFT, and referred here.     Assessment & Plan:  Abnormal TFT, new to me.  the suppressed TSH along with low T4 is a typical pattern for a multinodular goiter.  He will develop hyperthyroidism with time.     Patient is advised the following: Patient Instructions  blood tests are requested for you today.  We'll let you know about the results. If it is overactive again, i'll prescribe for you a medication to slow the thyroid. if ever you have fever while taking methimazole, stop it and call us, even if the reason is obvious, because of the risk of a rare side-effect. Please come back for a follow-up appointment in 1 month.        Hyperthyroidism Hyperthyroidism is when the thyroid is too active (overactive). Your thyroid is a large gland that is located in your neck. The  thyroid helps to control how your body uses food (metabolism). When your thyroid is overactive, it produces too much of a hormone called thyroxine.  CAUSES Causes of hyperthyroidism may include:  Graves disease. This is when your immune system attacks the thyroid gland. This is the most common cause.  Inflammation of the thyroid gland.  Tumor in the thyroid gland or somewhere else.  Excessive use of thyroid medicines, including:  Prescription thyroid supplement.  Herbal supplements that mimic thyroid hormones.  Solid or fluid-filled lumps within your thyroid gland (thyroid nodules).  Excessive ingestion of iodine. RISK FACTORS  Being male.  Having a family history of thyroid conditions. SIGNS AND SYMPTOMS Signs and symptoms of hyperthyroidism may include:  Nervousness.  Inability to tolerate heat.  Unexplained weight loss.  Diarrhea.  Change in the texture of hair or skin.  Heart skipping beats or making extra beats.  Rapid heart rate.  Loss of menstruation.  Shaky hands.  Fatigue.  Restlessness.  Increased appetite.  Sleep problems.  Enlarged thyroid gland or nodules. DIAGNOSIS  Diagnosis of hyperthyroidism may include:  Medical history and physical exam.  Blood tests.  Ultrasound tests. TREATMENT Treatment may include:  Medicines to control your thyroid.  Surgery to remove your thyroid.  Radiation therapy. HOME CARE INSTRUCTIONS   Take medicines only as directed by your health care provider.  Do not use any tobacco products, including cigarettes, chewing tobacco, or electronic cigarettes. If you need help quitting, ask your health care provider.  Do not exercise or do physical activity until your health care provider approves.  Keep all follow-up appointments as directed by your health care provider. This is important. SEEK MEDICAL CARE IF:  Your symptoms do not get better with treatment.  You have fever.  You are taking thyroid  replacement medicine and you:  Have depression.  Feel mentally and physically slow.  Have weight gain. SEEK IMMEDIATE MEDICAL CARE IF:   You have decreased alertness or a change in your awareness.  You have abdominal pain.  You feel dizzy.  You have a rapid heartbeat.  You have an irregular heartbeat.   This information is not intended to replace advice given to you by your health care provider. Make sure you discuss any questions you have with your health care provider.   Document Released: 01/03/2005 Document Revised: 01/24/2014 Document Reviewed: 05/21/2013 Elsevier Interactive Patient Education [employer] Elsevier Inc.

## 2015-01-02 ENCOUNTER — Ambulatory Visit: Payer: Managed Care, Other (non HMO) | Admitting: Endocrinology

## 2015-01-20 ENCOUNTER — Ambulatory Visit: Payer: Managed Care, Other (non HMO) | Admitting: Endocrinology

## 2015-01-29 ENCOUNTER — Other Ambulatory Visit: Payer: Self-pay

## 2015-01-29 ENCOUNTER — Encounter: Payer: Self-pay | Admitting: Endocrinology

## 2015-01-29 ENCOUNTER — Ambulatory Visit (INDEPENDENT_AMBULATORY_CARE_PROVIDER_SITE_OTHER): Payer: Managed Care, Other (non HMO) | Admitting: Endocrinology

## 2015-01-29 DIAGNOSIS — E0590 Thyrotoxicosis, unspecified without thyrotoxic crisis or storm: Secondary | ICD-10-CM

## 2015-01-29 LAB — T4, FREE: Free T4: 0.69 ng/dL (ref 0.60–1.60)

## 2015-01-29 LAB — TSH: TSH: 0.24 u[IU]/mL — ABNORMAL LOW (ref 0.35–4.50)

## 2015-01-29 NOTE — Patient Instructions (Signed)
blood tests are requested for you today.  We'll let you know about the results.  If it is off, I'll prescribe a pill for you.  If it is normal, please see Dr Drue NovelPaz about your symptoms.   Please come back for a follow-up appointment in 3-6 months.

## 2015-01-29 NOTE — Progress Notes (Signed)
   Subjective:    Patient ID: Hunter Mitchell, male    DOB: Aug 28, 1965, 50 y.o.   MRN: [id number]  HPI  Pt returns for f/u of mild hyperthyroidism (dx'ed 2012; he has never been on therapy for this; he has never had thyroid imaging).   He continues to report difficulty with concentration.   Past Medical History  Diagnosis Date  . Syncope 11-2009    ECHO 01-2010 normal    Past Surgical History  Procedure Laterality Date  . No past surgeries      Social History   Social History  . Marital Status: Married    Spouse Name: N/A  . Number of Children: 3  . Years of Education: N/A   Occupational History  . [employer]    Social History Main Topics  . Smoking status: Never Smoker   . Smokeless tobacco: Never Used  . Alcohol Use: 0.0 oz/week    0 Standard drinks or equivalent per week     Comment: socially   . Drug Use: No  . Sexual Activity: Not on file   Other Topics Concern  . Not on file   Social History Narrative   Children x 3,biological, 4 step children          No current outpatient prescriptions on file prior to visit.   No current facility-administered medications on file prior to visit.    No Known Allergies  Family History  Problem Relation Age of Onset  . Colon cancer Neg Hx   . Breast cancer Neg Hx   . Prostate cancer Neg Hx   . Heart attack Neg Hx   . Diabetes Neg Hx   . Thyroid disease Neg Hx     BP 147/80 mmHg  Pulse 89  Temp(Src) 98.6 F (37 C) (Oral)  Ht 5\' 8"  (1.727 m)  Wt 123 lb (55.792 kg)  BMI 18.71 kg/m2  SpO2 98%   Review of Systems He has intermittent tremor, but no fever.     Objective:   Physical Exam VITAL SIGNS:  See vs page GENERAL: no distress NECK: There is no palpable thyroid enlargement.  No thyroid nodule is palpable.    Nodes: no palpable lymphadenopathy at the anterior neck.    Lab Results  Component Value Date   TSH 0.24* 01/29/2015      Assessment & Plan:  Hyperthyroidism: mild but persistent.  In  view of sxs, we'll normalize TSH with tapazole.  Patient is advised the following: Patient Instructions  blood tests are requested for you today.  We'll let you know about the results.  If it is off, I'll prescribe a pill for you.  If it is normal, please see Dr Drue NovelPaz about your symptoms.   Please come back for a follow-up appointment in 3-6 months.   addendum: i have sent a prescription to your pharmacy, for tapazole

## 2015-01-30 MED ORDER — METHIMAZOLE 5 MG PO TABS: 5.0000 mg | ORAL_TABLET | ORAL | Status: DC

## 2015-07-29 ENCOUNTER — Ambulatory Visit: Payer: Managed Care, Other (non HMO) | Admitting: Endocrinology

## 2015-09-02 ENCOUNTER — Ambulatory Visit (INDEPENDENT_AMBULATORY_CARE_PROVIDER_SITE_OTHER): Payer: Managed Care, Other (non HMO) | Admitting: Endocrinology

## 2015-09-02 ENCOUNTER — Encounter: Payer: Self-pay | Admitting: Endocrinology

## 2015-09-02 VITALS — BP 142/92 | HR 73 | Temp 98.2°F | Ht 68.0 in | Wt 120.4 lb

## 2015-09-02 DIAGNOSIS — E0590 Thyrotoxicosis, unspecified without thyrotoxic crisis or storm: Secondary | ICD-10-CM

## 2015-09-02 LAB — T4, FREE: Free T4: 0.64 ng/dL (ref 0.60–1.60)

## 2015-09-02 LAB — TSH: TSH: 0.4 u[IU]/mL (ref 0.35–4.50)

## 2015-09-02 NOTE — Patient Instructions (Addendum)
Thyroid blood tests are requested for you today.  We'll let you know about the results.   If ever you have fever while taking methimazole, stop it and call us, even if the reason is obvious, because of the risk of a rare side-effect.   Please come back for a follow-up appointment in 6 months.    

## 2015-09-02 NOTE — Progress Notes (Signed)
   Subjective:    Patient ID: Hunter Mitchell, male    DOB: 09/18/1965, 50 y.o.   MRN: [id number]  HPI Pt returns for f/u of mild hyperthyroidism (dx'ed 2012; he has never been on therapy for this; he has never had thyroid imaging; tapazole was chosen as rx, due to mild degree of TSH abnormality).  He says since on the tapazole, difficulty with concentration is improved.   Past Medical History:  Diagnosis Date  . Syncope 11-2009   ECHO 01-2010 normal    Past Surgical History:  Procedure Laterality Date  . NO PAST SURGERIES      Social History   Social History  . Marital status: Married    Spouse name: N/A  . Number of children: 3  . Years of education: N/A   Occupational History  . [employer]    Social History Main Topics  . Smoking status: Never Smoker  . Smokeless tobacco: Never Used  . Alcohol use 0.0 oz/week     Comment: socially   . Drug use: No  . Sexual activity: Not on file   Other Topics Concern  . Not on file   Social History Narrative   Children x 3,biological, 4 step children          Current Outpatient Prescriptions on File Prior to Visit  Medication Sig Dispense Refill  . methimazole (TAPAZOLE) 5 MG tablet Take 1 tablet (5 mg total) by mouth 3 (three) times a week. 15 tablet 2   No current facility-administered medications on file prior to visit.     No Known Allergies  Family History  Problem Relation Age of Onset  . Colon cancer Neg Hx   . Breast cancer Neg Hx   . Prostate cancer Neg Hx   . Heart attack Neg Hx   . Diabetes Neg Hx   . Thyroid disease Neg Hx     BP (!) 142/92 (BP Location: Left Arm, Patient Position: Sitting, Cuff Size: Small)   Pulse 73   Temp 98.2 F (36.8 C) (Oral)   Ht 5\' 8"  (1.727 m)   Wt 120 lb 6.4 oz (54.6 kg)   SpO2 98%   BMI 18.31 kg/m    Review of Systems Denies fever.      Objective:   Physical Exam VITAL SIGNS:  See vs page.  GENERAL: no distress.  NECK: Thyroid is minimally and diffusely  enlarged.  No thyroid nodule is palpable.  No palpable lymphadenopathy at the anterior neck.   Lab Results  Component Value Date   TSH 0.40 09/02/2015      Assessment & Plan:  Hyperthyroidism: well-controlled.  Please continue the same medication.

## 2015-09-03 ENCOUNTER — Telehealth: Payer: Self-pay

## 2015-09-03 NOTE — Telephone Encounter (Signed)
Called patient and gave lab results. Patient had no questions or concerns.  

## 2015-09-03 NOTE — Telephone Encounter (Signed)
-----   Message from Romero BellingSean Ellison, MD sent at 09/02/2015  7:18 PM EDT ----- please call patient: Better. Please continue the same medication. I'll see you next time.

## 2015-09-03 NOTE — Telephone Encounter (Signed)
See note

## 2016-02-05 ENCOUNTER — Encounter: Payer: Managed Care, Other (non HMO) | Admitting: Internal Medicine

## 2016-03-04 ENCOUNTER — Ambulatory Visit: Payer: Managed Care, Other (non HMO) | Admitting: Endocrinology

## 2016-03-22 ENCOUNTER — Encounter: Payer: Self-pay | Admitting: Endocrinology

## 2016-03-22 ENCOUNTER — Ambulatory Visit (INDEPENDENT_AMBULATORY_CARE_PROVIDER_SITE_OTHER): Payer: Managed Care, Other (non HMO) | Admitting: Endocrinology

## 2016-03-22 VITALS — BP 134/84 | HR 85 | Ht 68.0 in | Wt 123.0 lb

## 2016-03-22 DIAGNOSIS — E0590 Thyrotoxicosis, unspecified without thyrotoxic crisis or storm: Secondary | ICD-10-CM

## 2016-03-22 LAB — T4, FREE: FREE T4: 0.72 ng/dL (ref 0.60–1.60)

## 2016-03-22 LAB — TSH: TSH: 0.71 u[IU]/mL (ref 0.35–4.50)

## 2016-03-22 MED ORDER — METHIMAZOLE 5 MG PO TABS: 5.0000 mg | ORAL_TABLET | ORAL | 11 refills | Status: DC

## 2016-03-22 NOTE — Progress Notes (Signed)
   Subjective:    Patient ID: Hunter Mitchell, male    DOB: 20-Jul-1965, 51 y.o.   MRN: [id number]  HPI Pt returns for f/u of mild hyperthyroidism (dx'ed 2012; he has never been on therapy for this; he has never had thyroid imaging; tapazole was chosen as rx, due to mild degree of TSH abnormality).  He ran out of tapazole a few mos ago.   Past Medical History:  Diagnosis Date  . Syncope 11-2009   ECHO 01-2010 normal    Past Surgical History:  Procedure Laterality Date  . NO PAST SURGERIES      Social History   Social History  . Marital status: Married    Spouse name: N/A  . Number of children: 3  . Years of education: N/A   Occupational History  . [employer]    Social History Main Topics  . Smoking status: Never Smoker  . Smokeless tobacco: Never Used  . Alcohol use 0.0 oz/week     Comment: socially   . Drug use: No  . Sexual activity: Not on file   Other Topics Concern  . Not on file   Social History Narrative   Children x 3,biological, 4 step children          No current outpatient prescriptions on file prior to visit.   No current facility-administered medications on file prior to visit.     No Known Allergies  Family History  Problem Relation Age of Onset  . Colon cancer Neg Hx   . Breast cancer Neg Hx   . Prostate cancer Neg Hx   . Heart attack Neg Hx   . Diabetes Neg Hx   . Thyroid disease Neg Hx     BP 134/84   Pulse 85   Ht 5\' 8"  (1.727 m)   Wt 123 lb (55.8 kg)   SpO2 97%   BMI 18.70 kg/m    Review of Systems Denies fever.     Objective:   Physical Exam VITAL SIGNS:  See vs page.  GENERAL: no distress.  NECK: Thyroid is minimally and diffusely enlarged.  No thyroid nodule is palpable.  No palpable lymphadenopathy at the anterior neck.   Lab Results  Component Value Date   TSH 0.71 03/22/2016      Assessment & Plan:  Hyperthyroidism: therapy limited by noncompliance.  Unless tapazole is resumed, it will recur. I have sent a  prescription to your pharmacy

## 2016-03-22 NOTE — Patient Instructions (Addendum)
Thyroid blood tests are requested for you today.  We'll let you know about the results.  If the results are good, you should still resume the methimazole.  If ever you have fever while taking methimazole, stop it and call us, even if the reason is obvious, because of the risk of a rare side-effect.  Please come back for a follow-up appointment in 6 months.

## 2016-03-30 ENCOUNTER — Encounter: Payer: Self-pay | Admitting: Internal Medicine

## 2016-03-30 ENCOUNTER — Ambulatory Visit (INDEPENDENT_AMBULATORY_CARE_PROVIDER_SITE_OTHER): Payer: Managed Care, Other (non HMO) | Admitting: Internal Medicine

## 2016-03-30 VITALS — BP 132/68 | HR 68 | Temp 98.2°F | Resp 14 | Ht 68.0 in | Wt 123.1 lb

## 2016-03-30 DIAGNOSIS — R7989 Other specified abnormal findings of blood chemistry: Secondary | ICD-10-CM | POA: Diagnosis not present

## 2016-03-30 DIAGNOSIS — Z23 Encounter for immunization: Secondary | ICD-10-CM | POA: Diagnosis not present

## 2016-03-30 DIAGNOSIS — Z0000 Encounter for general adult medical examination without abnormal findings: Secondary | ICD-10-CM

## 2016-03-30 DIAGNOSIS — Z114 Encounter for screening for human immunodeficiency virus [HIV]: Secondary | ICD-10-CM

## 2016-03-30 DIAGNOSIS — R945 Abnormal results of liver function studies: Secondary | ICD-10-CM

## 2016-03-30 LAB — LIPID PANEL
CHOLESTEROL: 203 mg/dL — AB (ref 0–200)
HDL: 114.5 mg/dL (ref >39.00)
LDL Cholesterol: 69 mg/dL (ref 0–99)
NONHDL: 88.1
Total CHOL/HDL Ratio: 2
Triglycerides: 94 mg/dL (ref 0.0–149.0)
VLDL: 18.8 mg/dL (ref 0.0–40.0)

## 2016-03-30 LAB — CBC WITH DIFFERENTIAL/PLATELET
BASOS PCT: 0.6 % (ref 0.0–3.0)
Basophils Absolute: 0 10*3/uL (ref 0.0–0.1)
EOS PCT: 1.1 % (ref 0.0–5.0)
Eosinophils Absolute: 0 10*3/uL (ref 0.0–0.7)
HCT: 41.4 % (ref 39.0–52.0)
Hemoglobin: 13.7 g/dL (ref 13.0–17.0)
LYMPHS ABS: 1.3 10*3/uL (ref 0.7–4.0)
Lymphocytes Relative: 48.9 % — ABNORMAL HIGH (ref 12.0–46.0)
MCHC: 33.1 g/dL (ref 30.0–36.0)
MCV: 89.7 fl (ref 78.0–100.0)
MONO ABS: 0.4 10*3/uL (ref 0.1–1.0)
Monocytes Relative: 13.8 % — ABNORMAL HIGH (ref 3.0–12.0)
NEUTROS ABS: 1 10*3/uL — AB (ref 1.4–7.7)
NEUTROS PCT: 35.6 % — AB (ref 43.0–77.0)
Platelets: 227 10*3/uL (ref 150.0–400.0)
RBC: 4.61 Mil/uL (ref 4.22–5.81)
RDW: 14.1 % (ref 11.5–15.5)
WBC: 2.7 10*3/uL — ABNORMAL LOW (ref 4.0–10.5)

## 2016-03-30 LAB — COMPREHENSIVE METABOLIC PANEL
ALBUMIN: 4.6 g/dL (ref 3.5–5.2)
ALK PHOS: 43 U/L (ref 39–117)
ALT: 40 U/L (ref 0–53)
AST: 60 U/L — ABNORMAL HIGH (ref 0–37)
BUN: 8 mg/dL (ref 6–23)
CALCIUM: 9.8 mg/dL (ref 8.4–10.5)
CHLORIDE: 102 meq/L (ref 96–112)
CO2: 27 mEq/L (ref 19–32)
Creatinine, Ser: 0.77 mg/dL (ref 0.40–1.50)
GFR: 138 mL/min (ref >60.00)
Glucose, Bld: 91 mg/dL (ref 70–99)
POTASSIUM: 3.9 meq/L (ref 3.5–5.1)
Sodium: 139 mEq/L (ref 135–145)
TOTAL PROTEIN: 7.9 g/dL (ref 6.0–8.3)
Total Bilirubin: 0.5 mg/dL (ref 0.2–1.2)

## 2016-03-30 LAB — PSA: PSA: 2.6 ng/mL (ref 0.10–4.00)

## 2016-03-30 NOTE — Patient Instructions (Signed)
GO TO THE LAB : Get the blood work     GO TO THE FRONT DESK Schedule your next appointment for a  physical exam in one year  For constipation: Metamucil, 2 capsules daily with breakfast Senokot daily as needed only Call if severe symptoms, blood in the stools, diarrhea or unexpected weight loss

## 2016-03-30 NOTE — Assessment & Plan Note (Addendum)
Td  03/30/2016 Pt is AA, needs yearly screening for colon and prostate cancer. --CCS: iFOB neg 2015 , IFOB provided today  --DRE PSA wnl 2015; DRE today neg, recheck a PSA --Labs: CMP, FLP, CBC, PSA, HIV -Diet and exercise discussed, recommend liberal diet.

## 2016-03-30 NOTE — Progress Notes (Signed)
Subjective:    Patient ID: Hunter Mitchell, male    DOB: [birth year], 51 y.o.   MRN: [id number]  DOS:  03/30/2016 Type of visit - description : cpx Interval history: In general feeling well. Sees endocrinology regularly.   Review of Systems occasional constipation for the last 2 months, taking OTC Metamucil and other medications with relatively good control. Denies nausea, vomiting, abdominal pain, blood in the stools. No unexplained weight loss or diarrhea.  Other than above, a 14 point review of systems is negative     Past Medical History:  Diagnosis Date  . Syncope 11-2009   ECHO 01-2010 normal    Past Surgical History:  Procedure Laterality Date  . NO PAST SURGERIES      Social History   Social History  . Marital status: Married    Spouse name: N/A  . Number of children: 3  . Years of education: N/A   Occupational History  . [employer]    Social History Main Topics  . Smoking status: Never Smoker  . Smokeless tobacco: Never Used  . Alcohol use 0.0 oz/week     Comment: socially   . Drug use: No  . Sexual activity: Not on file   Other Topics Concern  . Not on file   Social History Narrative   Household- pt , wife, a nephew (autistic)   Children x 3,biological, 4 step children           Family History  Problem Relation Age of Onset  . Colon cancer Neg Hx   . Breast cancer Neg Hx   . Prostate cancer Neg Hx   . Heart attack Neg Hx   . Diabetes Neg Hx   . Thyroid disease Neg Hx      Allergies as of 03/30/2016   No Known Allergies     Medication List       Accurate as of 03/30/16 11:59 PM. Always use your most recent med list.          methimazole 5 MG tablet Commonly known as:  TAPAZOLE Take 1 tablet (5 mg total) by mouth 3 (three) times a week.          Objective:   Physical Exam BP 132/68 (BP Location: Left Arm, Patient Position: Sitting, Cuff Size: Small)   Pulse 68   Temp 98.2 F (36.8 C) (Oral)   Resp 14   Ht 5\' 8"   (1.727 m)   Wt 123 lb 2 oz (55.8 kg)   SpO2 97%   BMI 18.72 kg/m   General:   Well developed, Slightly under weight but healthy appearing. NAD.  Neck: No  thyromegaly  HEENT:  Normocephalic . Face symmetric, atraumatic Lungs:  CTA B Normal respiratory effort, no intercostal retractions, no accessory muscle use. Heart: RRR,  no murmur.  No pretibial edema bilaterally  Abdomen:  Not distended, soft, non-tender. No rebound or rigidity.   Skin: Exposed areas without rash. Not pale. Not jaundice Rectal:  External abnormalities: none. Normal sphincter tone. No rectal masses or tenderness.  Stool brown  Prostate: Prostate gland firm and smooth, no enlargement, nodularity, tenderness, mass, asymmetry or induration.  Neurologic:  alert & oriented X3.  Speech normal, gait appropriate for age and unassisted Strength symmetric and appropriate for age.  Psych: Cognition and judgment appear intact.  Cooperative with normal attention span and concentration.  Behavior appropriate. No anxious or depressed appearing.    Assessment & Plan:   Assessment Hyperthyroidism, per endo Syncope,  normal echo 2012  PLAN: Hyperthyroidism: per endoLow BMI, chronic and stable. Recommend to stay physically active and eat an abundant but healthy diet. Constipation: No red flag sxs, rec Metamucil, senna as needed, call if problems. RTC one year

## 2016-03-30 NOTE — Progress Notes (Signed)
Pre visit review using our clinic review tool, if applicable. No additional management support is needed unless otherwise documented below in the visit note. 

## 2016-03-31 DIAGNOSIS — Z09 Encounter for follow-up examination after completed treatment for conditions other than malignant neoplasm: Secondary | ICD-10-CM | POA: Insufficient documentation

## 2016-03-31 LAB — HIV ANTIBODY (ROUTINE TESTING W REFLEX): HIV 1&2 Ab, 4th Generation: NONREACTIVE

## 2016-03-31 NOTE — Assessment & Plan Note (Signed)
Hyperthyroidism: per endoLow BMI, chronic and stable. Recommend to stay physically active and eat an abundant but healthy diet. Constipation: No red flag sxs, rec Metamucil, senna as needed, call if problems. RTC one year

## 2016-04-01 NOTE — Addendum Note (Signed)
Addended byConrad Vanleer: Taevin Mcferran D on: 04/01/2016 01:58 PM   Modules accepted: Orders

## 2016-04-11 ENCOUNTER — Other Ambulatory Visit (INDEPENDENT_AMBULATORY_CARE_PROVIDER_SITE_OTHER): Payer: Managed Care, Other (non HMO)

## 2016-04-11 DIAGNOSIS — Z0000 Encounter for general adult medical examination without abnormal findings: Secondary | ICD-10-CM | POA: Diagnosis not present

## 2016-04-11 LAB — FECAL OCCULT BLOOD, IMMUNOCHEMICAL: Fecal Occult Bld: NEGATIVE

## 2016-07-29 ENCOUNTER — Telehealth: Payer: Self-pay | Admitting: Internal Medicine

## 2016-07-29 NOTE — Telephone Encounter (Signed)
Due for additional labs, see orders. Please arrange a lab appointment

## 2016-08-01 NOTE — Telephone Encounter (Signed)
Letter printed and mailed to Pt.  

## 2016-09-22 ENCOUNTER — Ambulatory Visit: Payer: Managed Care, Other (non HMO) | Admitting: Endocrinology

## 2016-09-26 ENCOUNTER — Other Ambulatory Visit: Payer: Managed Care, Other (non HMO)

## 2016-10-10 ENCOUNTER — Other Ambulatory Visit (INDEPENDENT_AMBULATORY_CARE_PROVIDER_SITE_OTHER): Payer: Managed Care, Other (non HMO)

## 2016-10-10 DIAGNOSIS — R945 Abnormal results of liver function studies: Secondary | ICD-10-CM

## 2016-10-10 DIAGNOSIS — R7989 Other specified abnormal findings of blood chemistry: Secondary | ICD-10-CM

## 2016-10-10 LAB — HEPATIC FUNCTION PANEL
ALBUMIN: 4.6 g/dL (ref 3.5–5.2)
ALT: 33 U/L (ref 0–53)
AST: 45 U/L — AB (ref 0–37)
Alkaline Phosphatase: 51 U/L (ref 39–117)
BILIRUBIN TOTAL: 0.6 mg/dL (ref 0.2–1.2)
Bilirubin, Direct: 0.2 mg/dL (ref 0.0–0.3)
Total Protein: 8.1 g/dL (ref 6.0–8.3)

## 2016-10-11 LAB — HEPATITIS B CORE ANTIBODY, TOTAL: Hep B Core Total Ab: REACTIVE — AB

## 2016-10-11 LAB — HEPATITIS C ANTIBODY
Hepatitis C Ab: NONREACTIVE
SIGNAL TO CUT-OFF: 0.01 (ref <1.00)

## 2016-10-11 LAB — HEPATITIS B SURFACE ANTIGEN: HEP B S AG: NONREACTIVE

## 2016-10-17 ENCOUNTER — Other Ambulatory Visit: Payer: Self-pay | Admitting: Internal Medicine

## 2016-10-17 DIAGNOSIS — R7989 Other specified abnormal findings of blood chemistry: Secondary | ICD-10-CM

## 2016-10-17 DIAGNOSIS — R945 Abnormal results of liver function studies: Principal | ICD-10-CM

## 2016-10-19 ENCOUNTER — Encounter: Payer: Self-pay | Admitting: Endocrinology

## 2016-10-19 ENCOUNTER — Ambulatory Visit (INDEPENDENT_AMBULATORY_CARE_PROVIDER_SITE_OTHER): Payer: Managed Care, Other (non HMO) | Admitting: Endocrinology

## 2016-10-19 VITALS — BP 152/92 | HR 80 | Wt 120.6 lb

## 2016-10-19 DIAGNOSIS — E0590 Thyrotoxicosis, unspecified without thyrotoxic crisis or storm: Secondary | ICD-10-CM

## 2016-10-19 LAB — T4, FREE: FREE T4: 0.65 ng/dL (ref 0.60–1.60)

## 2016-10-19 LAB — TSH: TSH: 0.5 u[IU]/mL (ref 0.35–4.50)

## 2016-10-19 NOTE — Progress Notes (Signed)
   Subjective:    Patient ID: Hunter Mitchell, male    DOB: 11-12-1965, 51 y.o.   MRN: [id number]  HPI Pt returns for f/u of mild hyperthyroidism (dx'ed 2012; he has never been on therapy for this; he has never had thyroid imaging; tapazole was chosen as rx, due to mild degree of TSH abnormality).  He takes tapazole as rx'ed.   Past Medical History:  Diagnosis Date  . Syncope 11-2009   ECHO 01-2010 normal    Past Surgical History:  Procedure Laterality Date  . NO PAST SURGERIES      Social History   Social History  . Marital status: Married    Spouse name: N/A  . Number of children: 3  . Years of education: N/A   Occupational History  . [employer]    Social History Main Topics  . Smoking status: Never Smoker  . Smokeless tobacco: Never Used  . Alcohol use 0.0 oz/week     Comment: socially   . Drug use: No  . Sexual activity: Not on file   Other Topics Concern  . Not on file   Social History Narrative   Household- pt , wife, a nephew (autistic)   Children x 3,biological, 4 step children          Current Outpatient Prescriptions on File Prior to Visit  Medication Sig Dispense Refill  . methimazole (TAPAZOLE) 5 MG tablet Take 1 tablet (5 mg total) by mouth 3 (three) times a week. 15 tablet 11   No current facility-administered medications on file prior to visit.     No Known Allergies  Family History  Problem Relation Age of Onset  . Colon cancer Neg Hx   . Breast cancer Neg Hx   . Prostate cancer Neg Hx   . Heart attack Neg Hx   . Diabetes Neg Hx   . Thyroid disease Neg Hx     BP (!) 152/92   Pulse 80   Wt 120 lb 9.6 oz (54.7 kg)   SpO2 98%   BMI 18.34 kg/m    Review of Systems Denies fever.     Objective:   Physical Exam VITAL SIGNS:  See vs page GENERAL: no distress NECK: There is no palpable thyroid enlargement.  No thyroid nodule is palpable.  No palpable lymphadenopathy at the anterior neck.       Assessment & Plan:    Hyperthyroidism: due for recheck.   Patient Instructions  Thyroid blood tests are requested for you today.  We'll let you know about the results.  If ever you have fever while taking methimazole, stop it and call us, even if the reason is obvious, because of the risk of a rare side-effect.  Please come back for a follow-up appointment in 6 months.

## 2016-10-19 NOTE — Patient Instructions (Signed)
Thyroid blood tests are requested for you today.  We'll let you know about the results.   If ever you have fever while taking methimazole, stop it and call us, even if the reason is obvious, because of the risk of a rare side-effect.   Please come back for a follow-up appointment in 6 months.    

## 2017-04-11 ENCOUNTER — Other Ambulatory Visit: Payer: Self-pay

## 2017-04-11 MED ORDER — METHIMAZOLE 5 MG PO TABS: 5.0000 mg | ORAL_TABLET | ORAL | 11 refills | Status: DC

## 2017-05-22 ENCOUNTER — Ambulatory Visit: Payer: Managed Care, Other (non HMO) | Admitting: Endocrinology

## 2017-05-26 ENCOUNTER — Ambulatory Visit: Payer: Managed Care, Other (non HMO) | Admitting: Endocrinology

## 2017-06-21 ENCOUNTER — Encounter: Payer: Self-pay | Admitting: Endocrinology

## 2017-06-21 ENCOUNTER — Ambulatory Visit: Payer: Managed Care, Other (non HMO) | Admitting: Endocrinology

## 2017-06-21 VITALS — BP 148/84 | HR 74 | Resp 99 | Wt 116.0 lb

## 2017-06-21 DIAGNOSIS — E0590 Thyrotoxicosis, unspecified without thyrotoxic crisis or storm: Secondary | ICD-10-CM | POA: Diagnosis not present

## 2017-06-21 LAB — TSH: TSH: 0.34 u[IU]/mL — ABNORMAL LOW (ref 0.35–4.50)

## 2017-06-21 LAB — T4, FREE: Free T4: 0.64 ng/dL (ref 0.60–1.60)

## 2017-06-21 MED ORDER — METHIMAZOLE 5 MG PO TABS: 5.0000 mg | ORAL_TABLET | ORAL | 11 refills | Status: DC

## 2017-06-21 NOTE — Patient Instructions (Signed)
Thyroid blood tests are requested for you today.  We'll let you know about the results.   If ever you have fever while taking methimazole, stop it and call us, even if the reason is obvious, because of the risk of a rare side-effect.   Please come back for a follow-up appointment in 6 months.    

## 2017-06-21 NOTE — Progress Notes (Signed)
Subjective:    Patient ID: Hunter Mitchell, male    DOB: [birth year], 52 y.o.   MRN: [id number]  HPI Pt returns for f/u of mild hyperthyroidism (dx'ed 2012; he has never been on therapy for this; he has never had thyroid imaging; tapazole was chosen as rx, due to mild degree of TSH abnormality).  He took tapazole as rx'ed, until he ran out 3 weeks ago.  pt states he feels well in general.  Past Medical History:  Diagnosis Date  . Syncope 11-2009   ECHO 01-2010 normal    Past Surgical History:  Procedure Laterality Date  . NO PAST SURGERIES      Social History   Socioeconomic History  . Marital status: Married    Spouse name: Not on file  . Number of children: 3  . Years of education: Not on file  . Highest education level: Not on file  Occupational History  . Occupation: [employer]  Social Needs  . Financial resource strain: Not on file  . Food insecurity:    Worry: Not on file    Inability: Not on file  . Transportation needs:    Medical: Not on file    Non-medical: Not on file  Tobacco Use  . Smoking status: Never Smoker  . Smokeless tobacco: Never Used  Substance and Sexual Activity  . Alcohol use: Yes    Alcohol/week: 0.0 oz    Comment: socially   . Drug use: No  . Sexual activity: Not on file  Lifestyle  . Physical activity:    Days per week: Not on file    Minutes per session: Not on file  . Stress: Not on file  Relationships  . Social connections:    Talks on phone: Not on file    Gets together: Not on file    Attends religious service: Not on file    Active member of club or organization: Not on file    Attends meetings of clubs or organizations: Not on file    Relationship status: Not on file  . Intimate partner violence:    Fear of current or ex partner: Not on file    Emotionally abused: Not on file    Physically abused: Not on file    Forced sexual activity: Not on file  Other Topics Concern  . Not on file  Social History Narrative   Household- pt , wife, a nephew (autistic)   Children x 3,biological, 4 step children       No current outpatient medications on file prior to visit.   No current facility-administered medications on file prior to visit.     No Known Allergies  Family History  Problem Relation Age of Onset  . Colon cancer Neg Hx   . Breast cancer Neg Hx   . Prostate cancer Neg Hx   . Heart attack Neg Hx   . Diabetes Neg Hx   . Thyroid disease Neg Hx     BP (!) 148/84   Pulse 74   Resp (!) 99   Wt 116 lb (52.6 kg)   BMI 17.64 kg/m   Review of Systems Denies fever    Objective:   Physical Exam VITAL SIGNS:  See vs page GENERAL: no distress NECK: There is no palpable thyroid enlargement.  No thyroid nodule is palpable.  No palpable lymphadenopathy at the anterior neck.   Lab Results  Component Value Date   TSH 0.34 (L) 06/21/2017  Assessment & Plan:  Hyperthyroidism: worse off rx.  Resume tapazole

## 2017-08-21 ENCOUNTER — Encounter: Payer: Managed Care, Other (non HMO) | Admitting: Internal Medicine

## 2017-12-21 ENCOUNTER — Ambulatory Visit: Payer: Managed Care, Other (non HMO) | Admitting: Endocrinology

## 2018-01-22 ENCOUNTER — Ambulatory Visit: Payer: Managed Care, Other (non HMO) | Admitting: Endocrinology

## 2018-07-04 ENCOUNTER — Ambulatory Visit (INDEPENDENT_AMBULATORY_CARE_PROVIDER_SITE_OTHER): Payer: Managed Care, Other (non HMO) | Admitting: Internal Medicine

## 2018-07-04 ENCOUNTER — Other Ambulatory Visit: Payer: Self-pay

## 2018-07-04 ENCOUNTER — Encounter: Payer: Self-pay | Admitting: Internal Medicine

## 2018-07-04 VITALS — BP 205/112 | HR 78 | Temp 98.4°F | Resp 16 | Ht 68.0 in | Wt 115.5 lb

## 2018-07-04 DIAGNOSIS — I169 Hypertensive crisis, unspecified: Secondary | ICD-10-CM | POA: Diagnosis not present

## 2018-07-04 DIAGNOSIS — I161 Hypertensive emergency: Secondary | ICD-10-CM

## 2018-07-04 DIAGNOSIS — E0590 Thyrotoxicosis, unspecified without thyrotoxic crisis or storm: Secondary | ICD-10-CM | POA: Diagnosis not present

## 2018-07-04 LAB — CBC WITH DIFFERENTIAL/PLATELET
Basophils Absolute: 0 10*3/uL (ref 0.0–0.1)
Basophils Relative: 1.5 % (ref 0.0–3.0)
Eosinophils Absolute: 0 10*3/uL (ref 0.0–0.7)
Eosinophils Relative: 0.6 % (ref 0.0–5.0)
HCT: 41.7 % (ref 39.0–52.0)
Hemoglobin: 13.9 g/dL (ref 13.0–17.0)
Lymphocytes Relative: 31.1 % (ref 12.0–46.0)
Lymphs Abs: 0.9 10*3/uL (ref 0.7–4.0)
MCHC: 33.4 g/dL (ref 30.0–36.0)
MCV: 89.4 fl (ref 78.0–100.0)
Monocytes Absolute: 0.4 10*3/uL (ref 0.1–1.0)
Monocytes Relative: 12.8 % — ABNORMAL HIGH (ref 3.0–12.0)
Neutro Abs: 1.5 10*3/uL (ref 1.4–7.7)
Neutrophils Relative %: 54 % (ref 43.0–77.0)
Platelets: 205 10*3/uL (ref 150.0–400.0)
RBC: 4.67 Mil/uL (ref 4.22–5.81)
RDW: 14 % (ref 11.5–15.5)
WBC: 2.9 10*3/uL — ABNORMAL LOW (ref 4.0–10.5)

## 2018-07-04 LAB — COMPREHENSIVE METABOLIC PANEL
ALT: 34 U/L (ref 0–53)
AST: 58 U/L — ABNORMAL HIGH (ref 0–37)
Albumin: 4.5 g/dL (ref 3.5–5.2)
Alkaline Phosphatase: 56 U/L (ref 39–117)
BUN: 7 mg/dL (ref 6–23)
CO2: 30 mEq/L (ref 19–32)
Calcium: 9.4 mg/dL (ref 8.4–10.5)
Chloride: 99 mEq/L (ref 96–112)
Creatinine, Ser: 0.64 mg/dL (ref 0.40–1.50)
GFR: 159.26 mL/min (ref >60.00)
Glucose, Bld: 94 mg/dL (ref 70–99)
Potassium: 3 mEq/L — ABNORMAL LOW (ref 3.5–5.1)
Sodium: 139 mEq/L (ref 135–145)
Total Bilirubin: 0.7 mg/dL (ref 0.2–1.2)
Total Protein: 7.8 g/dL (ref 6.0–8.3)

## 2018-07-04 LAB — T4, FREE: Free T4: 0.7 ng/dL (ref 0.60–1.60)

## 2018-07-04 LAB — TSH: TSH: 0.35 u[IU]/mL (ref 0.35–4.50)

## 2018-07-04 MED ORDER — AMLODIPINE BESYLATE 5 MG PO TABS: 5.0000 mg | ORAL_TABLET | Freq: Every day | ORAL | 1 refills | Status: DC

## 2018-07-04 MED ORDER — METHIMAZOLE 5 MG PO TABS: 5.0000 mg | ORAL_TABLET | ORAL | 0 refills | Status: DC

## 2018-07-04 MED ORDER — CARVEDILOL 6.25 MG PO TABS: 6.2500 mg | ORAL_TABLET | Freq: Two times a day (BID) | ORAL | 1 refills | Status: DC

## 2018-07-04 NOTE — Progress Notes (Signed)
Subjective:    Patient ID: Hunter Mitchell, male    DOB: 07-Apr-1965, 53 y.o.   MRN: [id number]  DOS:  07/04/2018 Type of visit - description: Acute Patient was here for his physical exam but his blood pressure was noted to be very high. The last time he was seen was in 2018, at that time BP was 152/92; BP was also slightly elevated 06/2017 and since then he has not checked his blood pressure. I check the blood pressure in both arms and confirmed the high readings. The patient reports he is feeling well. He specifically denies any using of recreational drugs particularly cocaine Does not take NSAIDs regularly Does not smoke. He admits to some stress, work-related.   BP Readings from Last 3 Encounters:  07/04/18 (!) 205/112  06/21/17 (!) 148/84  10/19/16 (!) 152/92     Review of Systems Denies fever chills No cough or chest pain No difficulty breathing No nausea, vomiting, diarrhea No headache No visual disturbances No lower extremity edema No nosebleeds  Past Medical History:  Diagnosis Date  . Syncope 11-2009   ECHO 01-2010 normal    Past Surgical History:  Procedure Laterality Date  . NO PAST SURGERIES      Social History   Socioeconomic History  . Marital status: Married    Spouse name: Not on file  . Number of children: 3  . Years of education: Not on file  . Highest education level: Not on file  Occupational History  . Occupation: [employer]  . Financial resource strain: Not on file  . Food insecurity    Worry: Not on file    Inability: Not on file  . Transportation needs    Medical: Not on file    Non-medical: Not on file  Tobacco Use  . Smoking status: Never Smoker  . Smokeless tobacco: Never Used  Substance and Sexual Activity  . Alcohol use: Yes    Alcohol/week: 0.0 standard drinks    Comment: socially   . Drug use: No  . Sexual activity: Not on file  Lifestyle  . Physical activity    Days per week: Not on file   Minutes per session: Not on file  . Stress: Not on file  Relationships  . Social Herbalist on phone: Not on file    Gets together: Not on file    Attends religious service: Not on file    Active member of club or organization: Not on file    Attends meetings of clubs or organizations: Not on file    Relationship status: Not on file  . Intimate partner violence    Fear of current or ex partner: Not on file    Emotionally abused: Not on file    Physically abused: Not on file    Forced sexual activity: Not on file  Other Topics Concern  . Not on file  Social History Narrative   Household- pt , wife, a nephew (autistic)   Children x 3,biological, 4 step children         Allergies as of 07/04/2018   No Known Allergies     Medication List       Accurate as of July 04, 2018 11:59 PM. If you have any questions, ask your nurse or doctor.        amLODipine 5 MG tablet Commonly known as: NORVASC Take 1 tablet (5 mg total) by mouth daily. Started by: Kathlene November, MD  carvedilol 6.25 MG tablet Commonly known as: COREG Take 1 tablet (6.25 mg total) by mouth 2 (two) times daily with a meal. Started by: Willow OraJose Paz, MD   methimazole 5 MG tablet Commonly known as: TAPAZOLE Take 1 tablet (5 mg total) by mouth 3 (three) times a week.           Objective:   Physical Exam BP (!) 205/112 (BP Location: Right Arm, Patient Position: Sitting, Cuff Size: Small)   Pulse 78   Temp 98.4 F (36.9 C) (Oral)   Resp 16   Ht 5\' 8"  (1.727 m)   Wt 115 lb 8 oz (52.4 kg)   SpO2 99%   BMI 17.56 kg/m  General:   Well developed, NAD, BMI noted.  HEENT:  Normocephalic . Face symmetric, atraumatic. Neck: No thyromegaly, normal carotid pulses Undilated funduscopy: Bilateral discs are sharp, I see no bleeding. Lungs:  CTA B Normal respiratory effort, no intercostal retractions, no accessory muscle use. Heart: RRR,  no murmur.  no pretibial edema bilaterally  Abdomen:  Not  distended, soft, non-tender. No rebound or rigidity.  No mass Skin: Not pale. Not jaundice Neurologic:  alert & oriented X3.  Speech normal, gait appropriate for age and unassisted Psych--  Cognition and judgment appear intact.  Cooperative with normal attention span and concentration.  Behavior appropriate. No anxious or depressed appearing.     Assessment     Assessment Hypertensive emergency 06/2018 Hyperthyroidism, per endo Syncope, normal echo 2012  Plan: Hypertensive urgency Upon arrival to the office, BP was 205/112. BP was repeated by me and I obtained 200/112 in both arms with a large cuff. The patient is completely asymptomatic, neurological and cardiovascular exam is normal.  Funduscopy, undilated: Normal. Per literature, this can be treated as an outpatient, BP can be decreased gradually as long as there is no endorgan damage. Plan:  EKG: Other than J-point elevation in V3 EKG is benign.  Discussed with cardiology. CMP, CBC and TFTs Start amlodipine 5 mg and carvedilol 5 mg twice daily. Consider losartan depending on labs.  Aware that he might need to go to the emergency room if labs are abnormal. Monitor BPs daily ER if chest pain, shortness of breath, headache, nausea, nosebleeds. History of hypothyroidism: Check TFTs. RTC 2 weeks

## 2018-07-04 NOTE — Patient Instructions (Addendum)
Get the blood work     GO TO THE FRONT DESK Schedule your next appointment for a checkup in 2 weeks      Check the  blood pressure daily Goal is between 110/65 and  135/85. I anticipate you should get there gradually.  Todayyour blood pressure is 200/112.  Avoid excessive salt in your diet  Go to the ER if you develop headache, nausea, nosebleeds, chest pain, difficulty breathing, swelling on your legs  Call Dr. Everardo AllEllison, you need to be seen, we are refilling 1 month supply of your thyroid medication   HOW TO TAKE YOUR BLOOD PRESSURE:   Rest 5 minutes before taking your blood pressure.   Don't smoke or drink caffeinated beverages for at least 30 minutes before.   Take your blood pressure before (not after) you eat.   Sit comfortably with your back supported and both feet on the floor (don't cross your legs).   Elevate your arm to heart level on a table or a desk.   Use the proper sized cuff. It should fit smoothly and snugly around your bare upper arm. There should be enough room to slip a fingertip under the cuff. The bottom edge of the cuff should be 1 inch above the crease of the elbow.   Ideally, take 3 measurements at one sitting and record the average.     Hypertension Hypertension, commonly called high blood pressure, is when the force of blood pumping through the arteries is too strong. The arteries are the blood vessels that carry blood from the heart throughout the body. Hypertension forces the heart to work harder to pump blood and may cause arteries to become narrow or stiff. Having untreated or uncontrolled hypertension can cause heart attacks, strokes, kidney disease, and other problems. A blood pressure reading consists of a higher number over a lower number. Ideally, your blood pressure should be below 120/80. The first ("top") number is called the systolic pressure. It is a measure of the pressure in your arteries as your heart beats. The second ("bottom")  number is called the diastolic pressure. It is a measure of the pressure in your arteries as the heart relaxes. What are the causes? The cause of this condition is not known. What increases the risk? Some risk factors for high blood pressure are under your control. Others are not. Factors you can change  Smoking.  Having type 2 diabetes mellitus, high cholesterol, or both.  Not getting enough exercise or physical activity.  Being overweight.  Having too much fat, sugar, calories, or salt (sodium) in your diet.  Drinking too much alcohol. Factors that are difficult or impossible to change  Having chronic kidney disease.  Having a family history of high blood pressure.  Age. Risk increases with age.  Race. You may be at higher risk if you are African-American.  Gender. Men are at higher risk than women before age 51. After age 51, women are at higher risk than men.  Having obstructive sleep apnea.  Stress. What are the signs or symptoms? Extremely high blood pressure (hypertensive crisis) may cause:  Headache.  Anxiety.  Shortness of breath.  Nosebleed.  Nausea and vomiting.  Severe chest pain.  Jerky movements you cannot control (seizures). How is this diagnosed? This condition is diagnosed by measuring your blood pressure while you are seated, with your arm resting on a surface. The cuff of the blood pressure monitor will be placed directly against the skin of your upper arm at  the level of your heart. It should be measured at least twice using the same arm. Certain conditions can cause a difference in blood pressure between your right and left arms. Certain factors can cause blood pressure readings to be lower or higher than normal (elevated) for a short period of time:  When your blood pressure is higher when you are in a health care provider's office than when you are at home, this is called white coat hypertension. Most people with this condition do not need  medicines.  When your blood pressure is higher at home than when you are in a health care provider's office, this is called masked hypertension. Most people with this condition may need medicines to control blood pressure. If you have a high blood pressure reading during one visit or you have normal blood pressure with other risk factors:  You may be asked to return on a different day to have your blood pressure checked again.  You may be asked to monitor your blood pressure at home for 1 week or longer. If you are diagnosed with hypertension, you may have other blood or imaging tests to help your health care provider understand your overall risk for other conditions. How is this treated? This condition is treated by making healthy lifestyle changes, such as eating healthy foods, exercising more, and reducing your alcohol intake. Your health care provider may prescribe medicine if lifestyle changes are not enough to get your blood pressure under control, and if:  Your systolic blood pressure is above 130.  Your diastolic blood pressure is above 80. Your personal target blood pressure may vary depending on your medical conditions, your age, and other factors. Follow these instructions at home: Eating and drinking   Eat a diet that is high in fiber and potassium, and low in sodium, added sugar, and fat. An example eating plan is called the DASH (Dietary Approaches to Stop Hypertension) diet. To eat this way: ? Eat plenty of fresh fruits and vegetables. Try to fill half of your plate at each meal with fruits and vegetables. ? Eat whole grains, such as whole wheat pasta, brown rice, or whole grain bread. Fill about one quarter of your plate with whole grains. ? Eat or drink low-fat dairy products, such as skim milk or low-fat yogurt. ? Avoid fatty cuts of meat, processed or cured meats, and poultry with skin. Fill about one quarter of your plate with lean proteins, such as fish, chicken without  skin, beans, eggs, and tofu. ? Avoid premade and processed foods. These tend to be higher in sodium, added sugar, and fat.  Reduce your daily sodium intake. Most people with hypertension should eat less than 1,500 mg of sodium a day.  Limit alcohol intake to no more than 1 drink a day for nonpregnant women and 2 drinks a day for men. One drink equals 12 oz of beer, 5 oz of wine, or 1 oz of hard liquor. Lifestyle   Work with your health care provider to maintain a healthy body weight or to lose weight. Ask what an ideal weight is for you.  Get at least 30 minutes of exercise that causes your heart to beat faster (aerobic exercise) most days of the week. Activities may include walking, swimming, or biking.  Include exercise to strengthen your muscles (resistance exercise), such as pilates or lifting weights, as part of your weekly exercise routine. Try to do these types of exercises for 30 minutes at least 3 days a  week.  Do not use any products that contain nicotine or tobacco, such as cigarettes and e-cigarettes. If you need help quitting, ask your health care provider.  Monitor your blood pressure at home as told by your health care provider.  Keep all follow-up visits as told by your health care provider. This is important. Medicines  Take over-the-counter and prescription medicines only as told by your health care provider. Follow directions carefully. Blood pressure medicines must be taken as prescribed.  Do not skip doses of blood pressure medicine. Doing this puts you at risk for problems and can make the medicine less effective.  Ask your health care provider about side effects or reactions to medicines that you should watch for. Contact a health care provider if:  You think you are having a reaction to a medicine you are taking.  You have headaches that keep coming back (recurring).  You feel dizzy.  You have swelling in your ankles.  You have trouble with your vision.  Get help right away if:  You develop a severe headache or confusion.  You have unusual weakness or numbness.  You feel faint.  You have severe pain in your chest or abdomen.  You vomit repeatedly.  You have trouble breathing. Summary  Hypertension is when the force of blood pumping through your arteries is too strong. If this condition is not controlled, it may put you at risk for serious complications.  Your personal target blood pressure may vary depending on your medical conditions, your age, and other factors. For most people, a normal blood pressure is less than 120/80.  Hypertension is treated with lifestyle changes, medicines, or a combination of both. Lifestyle changes include weight loss, eating a healthy, low-sodium diet, exercising more, and limiting alcohol. This information is not intended to replace advice given to you by your health care provider. Make sure you discuss any questions you have with your health care provider. Document Released: 01/03/2005 Document Revised: 12/02/2015 Document Reviewed: 12/02/2015 Elsevier Interactive Patient Education  2019 [employer]

## 2018-07-04 NOTE — Progress Notes (Signed)
Pre visit review using our clinic review tool, if applicable. No additional management support is needed unless otherwise documented below in the visit note. 

## 2018-07-05 ENCOUNTER — Telehealth: Payer: Self-pay | Admitting: Internal Medicine

## 2018-07-05 DIAGNOSIS — E876 Hypokalemia: Secondary | ICD-10-CM

## 2018-07-05 NOTE — Assessment & Plan Note (Signed)
Hypertensive urgency Upon arrival to the office, BP was 205/112. BP was repeated by me and I obtained 200/112 in both arms with a large cuff. The patient is completely asymptomatic, neurological and cardiovascular exam is normal.  Funduscopy, undilated: Normal. Per literature, this can be treated as an outpatient, BP can be decreased gradually as long as there is no endorgan damage. Plan:  EKG: Other than J-point elevation in V3 EKG is benign.  Discussed with cardiology. CMP, CBC and TFTs Start amlodipine 5 mg and carvedilol 5 mg twice daily. Consider losartan depending on labs.  Aware that he might need to go to the emergency room if labs are abnormal. Monitor BPs daily ER if chest pain, shortness of breath, headache, nausea, nosebleeds. History of hypothyroidism: Check TFTs. RTC 2 weeks

## 2018-07-05 NOTE — Telephone Encounter (Signed)
Kidney function normal but he has hypokalemia in the context of a hypertensive emergency. Needs to rule out primary hyperaldosteronism.  Aldosterone and renin activity test ordered, needs to be a morning sample while the patient is sitting. L MOM, will call him tomorrow again and explain.

## 2018-07-06 NOTE — Telephone Encounter (Signed)
Spoke with the patient, aware the results have few abnormalities particularly low potassium. He feels well, good med compliance, has not checked his BPs just yet. Needs blood work, Sherri please to schedule a lab visit.  Needs to be next week and in the morning

## 2018-07-09 ENCOUNTER — Telehealth: Payer: Self-pay | Admitting: Internal Medicine

## 2018-07-09 ENCOUNTER — Observation Stay (HOSPITAL_COMMUNITY)
Admission: EM | Admit: 2018-07-09 | Discharge: 2018-07-11 | Disposition: A | Discharge status: Home / Self Care | Payer: Managed Care, Other (non HMO) | Attending: Internal Medicine | Admitting: Internal Medicine

## 2018-07-09 ENCOUNTER — Other Ambulatory Visit: Payer: Self-pay

## 2018-07-09 ENCOUNTER — Encounter (HOSPITAL_COMMUNITY): Payer: Self-pay | Admitting: Emergency Medicine

## 2018-07-09 ENCOUNTER — Emergency Department (HOSPITAL_COMMUNITY): Payer: Managed Care, Other (non HMO)

## 2018-07-09 ENCOUNTER — Ambulatory Visit: Payer: Self-pay

## 2018-07-09 DIAGNOSIS — E0590 Thyrotoxicosis, unspecified without thyrotoxic crisis or storm: Secondary | ICD-10-CM | POA: Insufficient documentation

## 2018-07-09 DIAGNOSIS — E876 Hypokalemia: Secondary | ICD-10-CM | POA: Insufficient documentation

## 2018-07-09 DIAGNOSIS — R6883 Chills (without fever): Secondary | ICD-10-CM | POA: Diagnosis present

## 2018-07-09 DIAGNOSIS — I10 Essential (primary) hypertension: Secondary | ICD-10-CM | POA: Diagnosis not present

## 2018-07-09 DIAGNOSIS — R634 Abnormal weight loss: Secondary | ICD-10-CM | POA: Diagnosis not present

## 2018-07-09 DIAGNOSIS — F1010 Alcohol abuse, uncomplicated: Secondary | ICD-10-CM | POA: Diagnosis not present

## 2018-07-09 DIAGNOSIS — R911 Solitary pulmonary nodule: Secondary | ICD-10-CM | POA: Insufficient documentation

## 2018-07-09 DIAGNOSIS — R509 Fever, unspecified: Secondary | ICD-10-CM | POA: Insufficient documentation

## 2018-07-09 DIAGNOSIS — E162 Hypoglycemia, unspecified: Secondary | ICD-10-CM | POA: Insufficient documentation

## 2018-07-09 DIAGNOSIS — M1612 Unilateral primary osteoarthritis, left hip: Secondary | ICD-10-CM | POA: Insufficient documentation

## 2018-07-09 DIAGNOSIS — Z1159 Encounter for screening for other viral diseases: Secondary | ICD-10-CM | POA: Diagnosis not present

## 2018-07-09 DIAGNOSIS — R2681 Unsteadiness on feet: Secondary | ICD-10-CM | POA: Diagnosis not present

## 2018-07-09 DIAGNOSIS — E860 Dehydration: Secondary | ICD-10-CM | POA: Diagnosis not present

## 2018-07-09 DIAGNOSIS — R55 Syncope and collapse: Principal | ICD-10-CM | POA: Insufficient documentation

## 2018-07-09 DIAGNOSIS — Z681 Body mass index (BMI) 19 or less, adult: Secondary | ICD-10-CM | POA: Diagnosis not present

## 2018-07-09 DIAGNOSIS — M1611 Unilateral primary osteoarthritis, right hip: Secondary | ICD-10-CM | POA: Insufficient documentation

## 2018-07-09 DIAGNOSIS — Z79899 Other long term (current) drug therapy: Secondary | ICD-10-CM | POA: Diagnosis not present

## 2018-07-09 DIAGNOSIS — R9431 Abnormal electrocardiogram [ECG] [EKG]: Secondary | ICD-10-CM | POA: Insufficient documentation

## 2018-07-09 DIAGNOSIS — R740 Nonspecific elevation of levels of transaminase and lactic acid dehydrogenase [LDH]: Secondary | ICD-10-CM | POA: Insufficient documentation

## 2018-07-09 LAB — I-STAT CHEM 8, ED
BUN: 5 mg/dL — ABNORMAL LOW (ref 6–20)
Calcium, Ion: 0.93 mmol/L — ABNORMAL LOW (ref 1.15–1.40)
Chloride: 98 mmol/L (ref 98–111)
Creatinine, Ser: 0.6 mg/dL — ABNORMAL LOW (ref 0.61–1.24)
Glucose, Bld: 115 mg/dL — ABNORMAL HIGH (ref 70–99)
HCT: 45 % (ref 39.0–52.0)
Hemoglobin: 15.3 g/dL (ref 13.0–17.0)
Potassium: 3.9 mmol/L (ref 3.5–5.1)
Sodium: 132 mmol/L — ABNORMAL LOW (ref 135–145)
TCO2: 26 mmol/L (ref 22–32)

## 2018-07-09 LAB — URINALYSIS, ROUTINE W REFLEX MICROSCOPIC
Bilirubin Urine: NEGATIVE
Glucose, UA: NEGATIVE mg/dL
Hgb urine dipstick: NEGATIVE
Ketones, ur: 20 mg/dL — AB
Leukocytes,Ua: NEGATIVE
Nitrite: NEGATIVE
Protein, ur: NEGATIVE mg/dL
Specific Gravity, Urine: 1.006 (ref 1.005–1.030)
pH: 7 (ref 5.0–8.0)

## 2018-07-09 LAB — PROTIME-INR
INR: 1 (ref 0.8–1.2)
Prothrombin Time: 13.1 seconds (ref 11.4–15.2)

## 2018-07-09 LAB — RAPID URINE DRUG SCREEN, HOSP PERFORMED
Amphetamines: NOT DETECTED
Barbiturates: NOT DETECTED
Benzodiazepines: NOT DETECTED
Cocaine: NOT DETECTED
Opiates: NOT DETECTED
Tetrahydrocannabinol: NOT DETECTED

## 2018-07-09 LAB — CBC
HCT: 38.9 % — ABNORMAL LOW (ref 39.0–52.0)
Hemoglobin: 13.7 g/dL (ref 13.0–17.0)
MCH: 29.7 pg (ref 26.0–34.0)
MCHC: 35.2 g/dL (ref 30.0–36.0)
MCV: 84.2 fL (ref 80.0–100.0)
Platelets: 166 10*3/uL (ref 150–400)
RBC: 4.62 MIL/uL (ref 4.22–5.81)
RDW: 14.3 % (ref 11.5–15.5)
WBC: 4 10*3/uL (ref 4.0–10.5)
nRBC: 0 % (ref 0.0–0.2)

## 2018-07-09 LAB — BASIC METABOLIC PANEL
Anion gap: 16 — ABNORMAL HIGH (ref 5–15)
BUN: 11 mg/dL (ref 6–20)
CO2: 25 mmol/L (ref 22–32)
Calcium: 9 mg/dL (ref 8.9–10.3)
Chloride: 101 mmol/L (ref 98–111)
Creatinine, Ser: 0.71 mg/dL (ref 0.61–1.24)
GFR calc Af Amer: >60 mL/min (ref >60)
GFR calc non Af Amer: >60 mL/min (ref >60)
Glucose, Bld: 125 mg/dL — ABNORMAL HIGH (ref 70–99)
Potassium: 3.1 mmol/L — ABNORMAL LOW (ref 3.5–5.1)
Sodium: 142 mmol/L (ref 135–145)

## 2018-07-09 LAB — APTT: aPTT: 31 seconds (ref 24–36)

## 2018-07-09 LAB — ETHANOL: Alcohol, Ethyl (B): <10 mg/dL (ref <10)

## 2018-07-09 MED ORDER — ACETAMINOPHEN 325 MG PO TABS
650.0000 mg | ORAL_TABLET | Freq: Once | ORAL | Status: AC
  Administered 2018-07-09: 650 mg via ORAL
  Filled 2018-07-09: qty 2

## 2018-07-09 MED ORDER — SODIUM CHLORIDE 0.9 % IV BOLUS
1000.0000 mL | Freq: Once | INTRAVENOUS | Status: AC
  Administered 2018-07-09: 1000 mL via INTRAVENOUS

## 2018-07-09 MED ORDER — LORAZEPAM 2 MG/ML IJ SOLN
2.0000 mg | Freq: Once | INTRAMUSCULAR | Status: AC
  Administered 2018-07-10: 2 mg via INTRAVENOUS
  Filled 2018-07-09: qty 1

## 2018-07-09 MED ORDER — SODIUM CHLORIDE 0.9% FLUSH: 3.0000 mL | Freq: Once | INTRAVENOUS | Status: DC

## 2018-07-09 MED ORDER — ONDANSETRON HCL 4 MG/2ML IJ SOLN
4.0000 mg | Freq: Once | INTRAMUSCULAR | Status: AC
  Administered 2018-07-09: 4 mg via INTRAVENOUS
  Filled 2018-07-09: qty 2

## 2018-07-09 MED ORDER — POTASSIUM CHLORIDE CRYS ER 20 MEQ PO TBCR
40.0000 meq | EXTENDED_RELEASE_TABLET | Freq: Once | ORAL | Status: AC
  Administered 2018-07-09: 40 meq via ORAL
  Filled 2018-07-09: qty 2

## 2018-07-09 NOTE — Telephone Encounter (Signed)
Pt called stating that he strted new medication last Thursday for BP. He states that he got very weak at work. Today he feels very tired. BP 140/70 , rechecked 137/77 HR 70. He states that he has been very thirsty. Per protocol pt will go to ER for evaluation of his symptoms. Care advice read to wife.  She verbalized understanding.   Reason for Disposition . SEVERE dizziness (e.g., unable to stand, requires support to walk, feels like passing out now)  Answer Assessment - Initial Assessment Questions 1. DESCRIPTION: "Describe your dizziness."     lightheaded 2. LIGHTHEADED: "Do you feel lightheaded?" (e.g., somewhat faint, woozy, weak upon standing)     tired 3. VERTIGO: "Do you feel like either you or the room is spinning or tilting?" (i.e. vertigo)     no 4. SEVERITY: "How bad is it?"  "Do you feel like you are going to faint?" "Can you stand and walk?"   - MILD - walking normally   - MODERATE - interferes with normal activities (e.g., work, school)    - SEVERE - unable to stand, requires support to walk, feels like passing out now.     Unable to do normal activity 5. ONSET:  "When did the dizziness begin?"   Thursday after taking BP medication 6. AGGRAVATING FACTORS: "Does anything make it worse?" (e.g., standing, change in head position)     worse 7. HEART RATE: "Can you tell me your heart rate?" "How many beats in 15 seconds?"  (Note: not all patients can do this)      BP 140/70 , 137/77, 70 8. CAUSE: "What do you think is causing the dizziness?"    New  Medication for BP 9. RECURRENT SYMPTOM: "Have you had dizziness before?" If so, ask: "When was the last time?" "What happened that time?"     Thursday at work when started BP medication 10. OTHER SYMPTOMS: "Do you have any other symptoms?" (e.g., fever, chest pain, vomiting, diarrhea, bleeding)      no 11. PREGNANCY: "Is there any chance you are pregnant?" "When was your last menstrual period?"       N/A  Protocols used:  DIZZINESS [facility]

## 2018-07-09 NOTE — Telephone Encounter (Signed)
Incoming call from Patient Wife who states that She was advised to take her husband to [facility]  Ed.   R/T  High blood pressure, low blood Sugar,  And being dizzy.  Wife is very upset.  States her husband isn just laying there since 11am.  Wife wants him evaluated.

## 2018-07-09 NOTE — Telephone Encounter (Signed)
CaLLED PT LEFT MSG FOR LAB APPT

## 2018-07-09 NOTE — Telephone Encounter (Signed)
FYI

## 2018-07-09 NOTE — ED Notes (Signed)
Pt provided ED happy meal and ice water per RN, Abbe Amsterdam.

## 2018-07-09 NOTE — ED Triage Notes (Signed)
Pt reports he started some bp meds on Thursday and had a syncopal episode a few hours after. Reports ongoing lightheadedness.

## 2018-07-09 NOTE — ED Provider Notes (Signed)
[facility] Provider Note   CSN: [id number] Arrival date & time: 07/09/18  1130     History   Chief Complaint Chief Complaint  Patient presents with  . Near Syncope    HPI Hunter Mitchell is a 53 y.o. male who presents with syncope. PMH significant for hyperthyroidism.  He went for routine physical last Thursday.  Blood pressure was 205/112.  He had asymptomatic hypertension.  He was started on amlodipine 5 mg and Coreg 6.25 mg twice daily.  He states that initially he felt good after starting the medicine however on Saturday he was talking with a coworker and suddenly became very lightheaded and passed out.  He felt lightheaded after he being consciousness and his coworker gave him some water.  He also had blood work checked and he was noted to be hypokalemic.  His PCP is worried about primary hyperaldosteronism and testing for this is pending.  This morning he woke up and states he was very thirsty.  He has not ate or drank anything today.  He is advised to come to the emergency department for further evaluation.  He denies any headache, dizziness, vision changes, chest pain, shortness of breath, abdominal pain, vomiting or diarrhea.  Standing makes it worse.  Nothing is made it better. He has not taken his BP meds today. Pt states he drinks alcohol - he drinks ~2-3 beers with several shots of liquor every other day.    HPI  Past Medical History:  Diagnosis Date  . Syncope 11-2009   ECHO 01-2010 normal    Patient Active Problem List   Diagnosis Date Noted  . PCP NOTES >>>>>>>>>>>> 03/31/2016  . Hyperthyroidism 12/04/2014  . Annual physical exam 11/22/2013  . Fatigue 08/05/2010  . Libido, decreased 08/05/2010  . ELEVATED BP READING WITHOUT DX HYPERTENSION 01/05/2010    Past Surgical History:  Procedure Laterality Date  . NO PAST SURGERIES          Home Medications    Prior to Admission medications   Medication Sig Start Date  End Date Taking? Authorizing Provider  amLODipine (NORVASC) 5 MG tablet Take 1 tablet (5 mg total) by mouth daily. 07/04/18   Wanda PlumpPaz, Jose E, MD  carvedilol (COREG) 6.25 MG tablet Take 1 tablet (6.25 mg total) by mouth 2 (two) times daily with a meal. 07/04/18   Wanda PlumpPaz, Jose E, MD  methimazole (TAPAZOLE) 5 MG tablet Take 1 tablet (5 mg total) by mouth 3 (three) times a week. 07/04/18   Wanda PlumpPaz, Jose E, MD    Family History Family History  Problem Relation Age of Onset  . Colon cancer Neg Hx   . Breast cancer Neg Hx   . Prostate cancer Neg Hx   . Heart attack Neg Hx   . Diabetes Neg Hx   . Thyroid disease Neg Hx     Social History Social History   Tobacco Use  . Smoking status: Never Smoker  . Smokeless tobacco: Never Used  Substance Use Topics  . Alcohol use: Yes    Alcohol/week: 0.0 standard drinks    Comment: socially   . Drug use: No     Allergies   Patient has no known allergies.   Review of Systems Review of Systems  Constitutional: Positive for chills and fatigue. Negative for fever.  Eyes: Negative for visual disturbance.  Respiratory: Negative for shortness of breath.   Cardiovascular: Negative for chest pain.  Gastrointestinal: Negative for abdominal pain, diarrhea, nausea  and vomiting.  Neurological: Positive for syncope and light-headedness. Negative for dizziness and headaches.  All other systems reviewed and are negative.    Physical Exam Updated Vital Signs BP (!) 172/95 Comment: did not take bp meds today  Pulse 72   Temp 98.5 F (36.9 C)   Resp 15   SpO2 99%   Physical Exam Vitals signs and nursing note reviewed.  Constitutional:      General: He is not in acute distress.    Appearance: He is well-developed and underweight.     Comments: Shivering, thin appearing. Cooperative.  HENT:     Head: Normocephalic and atraumatic.  Eyes:     General: No scleral icterus.       Right eye: No discharge.        Left eye: No discharge.     Conjunctiva/sclera:  Conjunctivae normal.     Pupils: Pupils are equal, round, and reactive to light.  Neck:     Musculoskeletal: Normal range of motion.  Cardiovascular:     Rate and Rhythm: Normal rate and regular rhythm.  Pulmonary:     Effort: Pulmonary effort is normal. No respiratory distress.     Breath sounds: Normal breath sounds.  Abdominal:     General: There is no distension.     Palpations: Abdomen is soft.     Tenderness: There is no abdominal tenderness.  Musculoskeletal:     Right lower leg: No edema.     Left lower leg: No edema.  Skin:    General: Skin is warm and dry.  Neurological:     Mental Status: He is alert and oriented to person, place, and time.     Comments: Mental Status:  Alert, oriented, thought content appropriate, able to give a coherent history. Speech fluent without evidence of aphasia. Able to follow 2 step commands without difficulty.  Cranial Nerves:  II:  Peripheral visual fields grossly normal, pupils equal, round, reactive to light III,IV, VI: ptosis not present, extra-ocular motions intact bilaterally  V,VII: Unable to smile or frown. Facial light touch sensation equal VIII: hearing grossly normal to voice  X: uvula elevates symmetrically  XI: bilateral shoulder shrug symmetric but weak. XII: midline tongue extension without fassiculations Motor:  Normal tone. 5/5 in upper and lower extremities bilaterally including strong and equal grip strength and dorsiflexion/plantar flexion Sensory: Pinprick and light touch normal in all extremities.  Cerebellar: dysmetria bilaterally Gait: Unsteady, shuffling gait CV: distal pulses palpable throughout    Psychiatric:        Mood and Affect: Mood is anxious. Affect is flat.        Behavior: Behavior normal.      ED Treatments / Results  Labs (all labs ordered are listed, but only abnormal results are displayed) Labs Reviewed  BASIC METABOLIC PANEL - Abnormal; Notable for the following components:      Result  Value   Potassium 3.1 (*)    Glucose, Bld 125 (*)    Anion gap 16 (*)    All other components within normal limits  CBC - Abnormal; Notable for the following components:   HCT 38.9 (*)    All other components within normal limits  URINALYSIS, ROUTINE W REFLEX MICROSCOPIC - Abnormal; Notable for the following components:   Color, Urine COLORLESS (*)    Ketones, ur 20 (*)    All other components within normal limits  I-STAT CHEM 8, ED - Abnormal; Notable for the following components:   Sodium  132 (*)    BUN 5 (*)    Creatinine, Ser 0.60 (*)    Glucose, Bld 115 (*)    Calcium, Ion 0.93 (*)    All other components within normal limits  SARS CORONAVIRUS 2 (HOSPITAL ORDER, [place] [facility])  ETHANOL  PROTIME-INR  APTT  RAPID URINE DRUG SCREEN, HOSP PERFORMED    EKG EKG Interpretation  Date/Time:  Monday July 09 2018 12:05:54 EDT Ventricular Rate:  70 PR Interval:  180 QRS Duration: 84 QT Interval:  412 QTC Calculation: 444 R Axis:   64 Text Interpretation:  Normal sinus rhythm Minimal voltage criteria for LVH, may be normal variant Nonspecific ST and T wave abnormality Abnormal ECG No old tracing to compare Confirmed by Sherwood Gambler [phone]) on 07/09/2018 3:42:17 PM   Radiology No results found.  Procedures Procedures (including critical care time)  Medications Ordered in ED Medications  sodium chloride flush (NS) 0.9 % injection 3 mL (3 mLs Intravenous Not Given 07/09/18 1642)  acetaminophen (TYLENOL) tablet 650 mg (has no administration in time range)  sodium chloride 0.9 % bolus 1,000 mL (0 mLs Intravenous Stopped 07/09/18 2227)  potassium chloride SA (K-DUR) CR tablet 40 mEq (40 mEq Oral Given 07/09/18 1631)  sodium chloride 0.9 % bolus 1,000 mL (0 mLs Intravenous Stopped 07/09/18 2227)  ondansetron (ZOFRAN) injection 4 mg (4 mg Intravenous Given 07/09/18 1925)     Initial Impression / Assessment and Plan / ED Course  I have reviewed the triage  vital signs and the nursing notes.  Pertinent labs & imaging results that were available during my care of the patient were reviewed by me and considered in my medical decision making (see chart for details).  53 year old male presents with a syncopal episode and persistent lightheadedness. On initial exam he is having chills. BP is elevated but otherwise vitals are normal. CBC is normal. BMP is remarkable for hypokalemia (3.1) and elevated anion gap (16). Pt is very thin and states he is thirsty and hasn't eaten all day. He still feels lightheaded. Will give fluid bolus, check orthostatics, and reassess.  Rechecked pt after fluids. He still feels lightheaded. Orthostatics are positive for increase in HR with standing. He feels lightheaded and nauseous as well. Will give another fluid bolus.   10:33 PM Pt feels warm. He is reporting chills earlier and now feels hot. He is extremely tremulous and is having difficulty walking. Oral temp was taken and is normal. Will order rectal temp. Discussed with Dr. Regenia Skeeter. Will order MRI brain since he appears worse than on initial exam.   11:32 PM Rectal temp is 100.2 He does not have any specific infectious symptoms. Since picture is unclear, will order COVID test. Will give Ativan and obtain CIWA. Discussed with Dr. Alcario Drought who will admit for further management.   Final Clinical Impressions(s) / ED Diagnoses   Final diagnoses:  Syncope and collapse  Hypokalemia  Hypertension, unspecified type  Shaking chills    ED Discharge Orders    None       Recardo Evangelist, PA-C 07/09/18 2336    Sherwood Gambler, MD 07/09/18 2340

## 2018-07-09 NOTE — Telephone Encounter (Signed)
Patient's wife calling in stating that husband is in ED getting blood drawn and that is why he was unable to answer phone. States if they have to call again he is available.

## 2018-07-10 ENCOUNTER — Ambulatory Visit (HOSPITAL_BASED_OUTPATIENT_CLINIC_OR_DEPARTMENT_OTHER): Payer: Managed Care, Other (non HMO)

## 2018-07-10 ENCOUNTER — Observation Stay (HOSPITAL_COMMUNITY): Payer: Managed Care, Other (non HMO)

## 2018-07-10 DIAGNOSIS — R509 Fever, unspecified: Secondary | ICD-10-CM | POA: Diagnosis not present

## 2018-07-10 DIAGNOSIS — E0590 Thyrotoxicosis, unspecified without thyrotoxic crisis or storm: Secondary | ICD-10-CM

## 2018-07-10 DIAGNOSIS — R55 Syncope and collapse: Secondary | ICD-10-CM | POA: Diagnosis present

## 2018-07-10 DIAGNOSIS — R6883 Chills (without fever): Secondary | ICD-10-CM

## 2018-07-10 DIAGNOSIS — I10 Essential (primary) hypertension: Secondary | ICD-10-CM

## 2018-07-10 DIAGNOSIS — E876 Hypokalemia: Secondary | ICD-10-CM

## 2018-07-10 LAB — COMPREHENSIVE METABOLIC PANEL
ALT: 43 U/L (ref 0–44)
AST: 71 U/L — ABNORMAL HIGH (ref 15–41)
Albumin: 4.3 g/dL (ref 3.5–5.0)
Alkaline Phosphatase: 59 U/L (ref 38–126)
Anion gap: 12 (ref 5–15)
BUN: 6 mg/dL (ref 6–20)
CO2: 27 mmol/L (ref 22–32)
Calcium: 9.1 mg/dL (ref 8.9–10.3)
Chloride: 93 mmol/L — ABNORMAL LOW (ref 98–111)
Creatinine, Ser: 0.72 mg/dL (ref 0.61–1.24)
GFR calc Af Amer: >60 mL/min (ref >60)
GFR calc non Af Amer: >60 mL/min (ref >60)
Glucose, Bld: 120 mg/dL — ABNORMAL HIGH (ref 70–99)
Potassium: 3.7 mmol/L (ref 3.5–5.1)
Sodium: 132 mmol/L — ABNORMAL LOW (ref 135–145)
Total Bilirubin: 1.5 mg/dL — ABNORMAL HIGH (ref 0.3–1.2)
Total Protein: 8.1 g/dL (ref 6.5–8.1)

## 2018-07-10 LAB — CBC
HCT: 37.9 % — ABNORMAL LOW (ref 39.0–52.0)
Hemoglobin: 13.7 g/dL (ref 13.0–17.0)
MCH: 29.6 pg (ref 26.0–34.0)
MCHC: 36.1 g/dL — ABNORMAL HIGH (ref 30.0–36.0)
MCV: 81.9 fL (ref 80.0–100.0)
Platelets: 153 10*3/uL (ref 150–400)
RBC: 4.63 MIL/uL (ref 4.22–5.81)
RDW: 13.6 % (ref 11.5–15.5)
WBC: 3.9 10*3/uL — ABNORMAL LOW (ref 4.0–10.5)
nRBC: 0 % (ref 0.0–0.2)

## 2018-07-10 LAB — LACTIC ACID, PLASMA
Lactic Acid, Venous: 1.4 mmol/L (ref 0.5–1.9)
Lactic Acid, Venous: 1.1 mmol/L (ref 0.5–1.9)

## 2018-07-10 LAB — ECHOCARDIOGRAM COMPLETE

## 2018-07-10 LAB — SARS CORONAVIRUS 2 BY RT PCR (HOSPITAL ORDER, PERFORMED IN ~~LOC~~ HOSPITAL LAB): SARS Coronavirus 2: NEGATIVE

## 2018-07-10 LAB — HIV ANTIBODY (ROUTINE TESTING W REFLEX): HIV Screen 4th Generation wRfx: NONREACTIVE

## 2018-07-10 LAB — CBG MONITORING, ED: Glucose-Capillary: 76 mg/dL (ref 70–99)

## 2018-07-10 MED ORDER — AMLODIPINE BESYLATE 5 MG PO TABS
5.0000 mg | ORAL_TABLET | Freq: Every day | ORAL | Status: DC
  Administered 2018-07-10: 5 mg via ORAL
  Filled 2018-07-10: qty 1

## 2018-07-10 MED ORDER — SODIUM CHLORIDE 0.9% FLUSH
3.0000 mL | Freq: Two times a day (BID) | INTRAVENOUS | Status: DC
  Administered 2018-07-10 – 2018-07-11 (×3): 3 mL via INTRAVENOUS

## 2018-07-10 MED ORDER — CARVEDILOL 6.25 MG PO TABS
6.2500 mg | ORAL_TABLET | Freq: Two times a day (BID) | ORAL | Status: DC
  Administered 2018-07-10 – 2018-07-11 (×3): 6.25 mg via ORAL
  Filled 2018-07-10 (×3): qty 1

## 2018-07-10 MED ORDER — LORAZEPAM 2 MG/ML IJ SOLN: 1.0000 mg | Freq: Four times a day (QID) | INTRAMUSCULAR | Status: DC | PRN

## 2018-07-10 MED ORDER — LORAZEPAM 1 MG PO TABS
1.0000 mg | ORAL_TABLET | Freq: Four times a day (QID) | ORAL | Status: DC | PRN
  Administered 2018-07-11: 1 mg via ORAL
  Filled 2018-07-10: qty 1

## 2018-07-10 MED ORDER — METHIMAZOLE 5 MG PO TABS
5.0000 mg | ORAL_TABLET | ORAL | Status: DC
  Administered 2018-07-11: 5 mg via ORAL
  Filled 2018-07-10: qty 1

## 2018-07-10 MED ORDER — SODIUM CHLORIDE 0.9 % IV SOLN
INTRAVENOUS | Status: DC
  Administered 2018-07-10: 100 mL/h via INTRAVENOUS
  Administered 2018-07-10: 13:00:00 via INTRAVENOUS

## 2018-07-10 MED ORDER — IBUPROFEN 200 MG PO TABS
400.0000 mg | ORAL_TABLET | Freq: Four times a day (QID) | ORAL | Status: DC | PRN
  Administered 2018-07-11: 400 mg via ORAL
  Filled 2018-07-10: qty 2

## 2018-07-10 MED ORDER — THIAMINE HCL 100 MG/ML IJ SOLN
100.0000 mg | Freq: Every day | INTRAMUSCULAR | Status: DC
  Filled 2018-07-10: qty 2

## 2018-07-10 MED ORDER — ADULT MULTIVITAMIN W/MINERALS CH
1.0000 | ORAL_TABLET | Freq: Every day | ORAL | Status: DC
  Administered 2018-07-10 – 2018-07-11 (×2): 1 via ORAL
  Filled 2018-07-10 (×2): qty 1

## 2018-07-10 MED ORDER — VITAMIN B-1 100 MG PO TABS
100.0000 mg | ORAL_TABLET | Freq: Every day | ORAL | Status: DC
  Administered 2018-07-10 – 2018-07-11 (×2): 100 mg via ORAL
  Filled 2018-07-10 (×2): qty 1

## 2018-07-10 MED ORDER — ENOXAPARIN SODIUM 40 MG/0.4ML ~~LOC~~ SOLN
40.0000 mg | Freq: Every day | SUBCUTANEOUS | Status: DC
  Administered 2018-07-10 – 2018-07-11 (×2): 40 mg via SUBCUTANEOUS
  Filled 2018-07-10 (×2): qty 0.4

## 2018-07-10 MED ORDER — AMLODIPINE BESYLATE 5 MG PO TABS
7.5000 mg | ORAL_TABLET | Freq: Every day | ORAL | Status: DC
  Administered 2018-07-10: 7.5 mg via ORAL
  Filled 2018-07-10: qty 2

## 2018-07-10 MED ORDER — FOLIC ACID 1 MG PO TABS
1.0000 mg | ORAL_TABLET | Freq: Every day | ORAL | Status: DC
  Administered 2018-07-10 – 2018-07-11 (×2): 1 mg via ORAL
  Filled 2018-07-10 (×2): qty 1

## 2018-07-10 NOTE — ED Notes (Signed)
Patient transported to MRI 

## 2018-07-10 NOTE — Plan of Care (Addendum)
52 yo male admitted this morning with syncope at work. discussed with his wife, she is not sure how long he was passed out for.  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  The wife is not sure how much he drinks but she thinks he drinks a lot.  However he did not drink on Monday as he was not feeling well.  His serum alcohol level is less than 10.  He barely eats or drinks food.  He also has a history of hypothyroidism.  So far the work-up MRI of the brain is normal.  Echo is pending.  PT consult is pending.  He has a follow-up appointment PCP 07/25/2018.  Patient continues to be lightheaded.  His blood pressure is still elevated but better with Coreg and Norvasc.  LFTs are mildly elevated AST 71 total bilirubin 1.5 ALT 43.  Recheck these labs tomorrow.  UA significant for ketones ketonuria.  Blood cultures drawn.  Looking back through his labs he has hepatitis B core antibody positive in 2018 he will need follow-up appointment with GI.  He had subjective fever at home.  He is COVID negative.  Wife requesting a letter prior to discharge for return to work.  Will continue IV fluids, check magnesium.  He appears dehydrated with dry mucous membrane.  Syncope multifactorial..uncontrolled hypertension, working too long hours without eating, ongoing alcohol abuse.  His lung sounds clear abdomen soft no nausea vomiting or diarrhea no neck stiffness or signs of meningismus moves all extremities.  Patient needs continued hospital stay for IV fluids better control of his blood pressure and follow-up echo.

## 2018-07-10 NOTE — Evaluation (Signed)
Physical Therapy Evaluation Patient Details Name: Hunter Mitchell MRN: [id number] DOB: October 08, 1965 Today's Date: 07/10/2018   History of Present Illness  [age] yo male admitted this morning with syncope at work. MRI negative. ECHO pending. Pt with newly diagnosed asymptomatic HTN. Recently placed on medication a few days ago. Per wife pt also with recent alcohol abuse.  Clinical Impression  Pt with extremely flat affect and delayed response. Pt mildly unsteady scoring 16/24 on DGI. Pt reports lightedheadedness with ambulation, BP 147/96, reports sensation to go away once he lays down. Acute PT to cont to follow.    Follow Up Recommendations No PT follow up;Supervision/Assistance - 24 hour    Equipment Recommendations  None recommended by PT    Recommendations for Other Services       Precautions / Restrictions Precautions Precautions: Fall Precaution Comments: lightheaded with ambulation Restrictions Weight Bearing Restrictions: No      Mobility  Bed Mobility Overal bed mobility: Modified Independent             General bed mobility comments: HOB flat, no difficulty, no physical assist  Transfers Overall transfer level: Needs assistance Equipment used: None Transfers: Sit to/from Stand Sit to Stand: Supervision         General transfer comment: supervision for safety due to recent syncopal episode  Ambulation/Gait Ambulation/Gait assistance: Min guard Gait Distance (Feet): 160 Feet Assistive device: None Gait Pattern/deviations: Step-through pattern;Decreased stride length;Narrow base of support Gait velocity: decreased compared to normal Gait velocity interpretation: <1.31 ft/sec, indicative of household ambulator General Gait Details: pt with report "my legs still feel kind of funny" pt also reports feeling light headed which ceases once he sits down. Pt mildly unsteady with decraseed pace compared to baseline but no overt episode of LOB  Stairs             Wheelchair Mobility    Modified Rankin (Stroke Patients Only)       Balance                                 Standardized Balance Assessment Standardized Balance Assessment : Dynamic Gait Index   Dynamic Gait Index Level Surface: Mild Impairment Change in Gait Speed: Mild Impairment Gait with Horizontal Head Turns: Mild Impairment Gait with Vertical Head Turns: Mild Impairment Gait and Pivot Turn: Mild Impairment Step Over Obstacle: Mild Impairment Step Around Obstacles: Mild Impairment Steps: Mild Impairment Total Score: 16       Pertinent Vitals/Pain Pain Assessment: No/denies pain    Home Living Family/patient expects to be discharged to:: Private residence Living Arrangements: Spouse/significant other Available Help at Discharge: Family;Available PRN/intermittently(kids have moved out, wife works 3rd shift) Type of Home: House Home Access: Stairs to enter Entrance Stairs-Rails: Right Secretary/administratorntrance Stairs-Number of Steps: 5 Home Layout: Two level Home Equipment: None      Prior Function Level of Independence: Independent         Comments: working     Higher education careers adviserHand Dominance   Dominant Hand: Right    Extremity/Trunk Assessment   Upper Extremity Assessment Upper Extremity Assessment: Overall WFL for tasks assessed    Lower Extremity Assessment Lower Extremity Assessment: Generalized weakness    Cervical / Trunk Assessment Cervical / Trunk Assessment: Normal  Communication   Communication: No difficulties  Cognition Arousal/Alertness: Awake/alert Behavior During Therapy: Flat affect Overall Cognitive Status: No family/caregiver present to determine baseline cognitive functioning  General Comments: pt with very flat affect and delayed response time, unsure of baseline cognition but he was working      Pension scheme manager comments (skin integrity, edema, etc.): pt assisted to the  bathroom and was able to stand and urinate without LOB and min guard, pt also washed hands at sink without UE supprot    Exercises     Assessment/Plan    PT Assessment Patient needs continued PT services  PT Problem List Decreased strength;Decreased activity tolerance;Decreased balance;Decreased mobility;Decreased coordination;Decreased cognition;Decreased knowledge of use of DME       PT Treatment Interventions DME instruction;Gait training;Stair training;Functional mobility training;Therapeutic activities;Therapeutic exercise;Balance training    PT Goals (Current goals can be found in the Care Plan section)  Acute Rehab PT Goals Patient Stated Goal: get better PT Goal Formulation: With patient Time For Goal Achievement: 07/24/18 Potential to Achieve Goals: Good Additional Goals Additional Goal #1: Pt to score >19 on DGI to indicate minimal falls risk.    Frequency Min 3X/week   Barriers to discharge        Co-evaluation               AM-PAC PT "6 Clicks" Mobility  Outcome Measure Help needed turning from your back to your side while in a flat bed without using bedrails?: None Help needed moving from lying on your back to sitting on the side of a flat bed without using bedrails?: None Help needed moving to and from a bed to a chair (including a wheelchair)?: A Little Help needed standing up from a chair using your arms (e.g., wheelchair or bedside chair)?: A Little Help needed to walk in hospital room?: A Little Help needed climbing 3-5 steps with a railing? : A Little 6 Click Score: 20    End of Session Equipment Utilized During Treatment: Gait belt Activity Tolerance: Patient tolerated treatment well Patient left: in bed;with call bell/phone within reach Nurse Communication: Mobility status PT Visit Diagnosis: Unsteadiness on feet (R26.81);Muscle weakness (generalized) (M62.81);Difficulty in walking, not elsewhere classified (R26.2)    Time: 6767-2094 PT  Time Calculation (min) (ACUTE ONLY): 21 min   Charges:   PT Evaluation $PT Eval Low Complexity: 1 Low          Kittie Plater, PT, DPT Acute Rehabilitation Services Pager #: [phone] Office #: [phone]   Berline Lopes 07/10/2018, 2:36 PM

## 2018-07-10 NOTE — H&P (Signed)
History and Physical    Hunter NabHenry J Wisener YNW:[id number] DOB: Mar 14, 1965 DOA: 07/09/2018  PCP: Wanda PlumpPaz, Jose E, MD  Patient coming from: Home  I [facility] personally briefly [facility]  Chief Complaint: Syncope  HPI: Hunter Mitchell is a 53 y.o. male with medical history significant of hyperthyroidism.  Went for routine physical last Thursday.  Found to have asymptomatic HTN and started on amlodipine 5mg  and coreg 6.25mg  BID.  Initially felt well after taking meds, but on Sat had sudden onset of lightheadedness and syncope while talking to coworker.  This morning woke up very thirsty.  Sent to ED for evaluation.  Didn't take BP meds today.  PCP also noted hypokalemia on labs on Thurs and is planning work up for hyperaldosteronism, testing pending.  Does drink heavily every other day he says.  Never had withdrawal symptoms.   ED Course: Patient with chills and tremors in ED.  Noted to have Tm 100.2.  CXR, UA, neg for source.  Patient denies: Cough, SOB, neck stiffness, headache (had mild headache yesterday but none now), abd pain, N/V/D.   Review of Systems: As per HPI otherwise 10 point review of systems negative.   Past Medical History:  Diagnosis Date  . Syncope 11-2009   ECHO 01-2010 normal    Past Surgical History:  Procedure Laterality Date  . NO PAST SURGERIES       reports that he has never smoked. He has never used smokeless tobacco. He reports current alcohol use. He reports that he does not use drugs.  No Known Allergies  Family History  Problem Relation Age of Onset  . Colon cancer Neg Hx   . Breast cancer Neg Hx   . Prostate cancer Neg Hx   . Heart attack Neg Hx   . Diabetes Neg Hx   . Thyroid disease Neg Hx      Prior to Admission medications   Medication Sig Start Date End Date Taking? Authorizing Provider  amLODipine (NORVASC) 5 MG tablet Take 1 tablet (5 mg total) by mouth daily. Patient taking  differently: Take 5 mg by mouth at bedtime.  07/04/18  Yes Paz, Nolon RodJose E, MD  Ascorbic Acid (VITAMIN C PO) Take 1 tablet by mouth daily.   Yes [provider]  b complex vitamins tablet Take 1 tablet by mouth daily.   Yes [provider]  carvedilol (COREG) 6.25 MG tablet Take 1 tablet (6.25 mg total) by mouth 2 (two) times daily with a meal. Patient taking differently: Take 6.25 mg by mouth 2 (two) times daily with breakfast and lunch.  07/04/18  Yes Paz, Nolon RodJose E, MD  Docosahexaenoic Acid (DHA PO) Take 1 tablet by mouth daily.   Yes [provider]  ibuprofen (ADVIL) 200 MG tablet Take 400 mg by mouth every 6 (six) hours as needed (back pain).   Yes [provider]  methimazole (TAPAZOLE) 5 MG tablet Take 1 tablet (5 mg total) by mouth 3 (three) times a week. Patient taking differently: Take 5 mg by mouth every Monday, Wednesday, and Friday.  07/04/18  Yes Paz, Nolon RodJose E, MD  Multiple Vitamin (MULTIVITAMIN WITH MINERALS) TABS tablet Take 1 tablet by mouth daily.   Yes [provider]  Omega-3 Fatty Acids (FISH OIL PO) Take 1 capsule by mouth daily.   Yes [provider]    Physical Exam: Vitals:   07/10/18 0045 07/10/18 0100 07/10/18 0115 07/10/18 0130  BP:  [name](!)  155/96  (!) 160/92  Pulse: 71 72 67 77  Resp: 16 15 15 14   Temp:      TempSrc:      SpO2: 96% 94% 96% 98%    Constitutional: NAD, calm, comfortable Eyes: PERRL, lids and conjunctivae normal ENMT: Mucous membranes are moist. Posterior pharynx clear of any exudate or lesions.Normal dentition.  Neck: normal, supple, no masses, no thyromegaly Respiratory: clear to auscultation bilaterally, no wheezing, no crackles. Normal respiratory effort. No accessory muscle use.  Cardiovascular: Regular rate and rhythm, no murmurs / rubs / gallops. No extremity edema. 2+ pedal pulses. No carotid bruits.  Abdomen: no tenderness, no masses palpated. No hepatosplenomegaly. Bowel sounds positive.   Musculoskeletal: no clubbing / cyanosis. No joint deformity upper and lower extremities. Good ROM, no contractures. Normal muscle tone.  Skin: no rashes, lesions, ulcers. No induration Neurologic: CN 2-12 grossly intact. Sensation intact, DTR normal. Strength 5/5 in all 4.  Psychiatric: Normal judgment and insight. Alert and oriented x 3. Normal mood.    Labs on Admission: I have personally reviewed following labs and imaging studies  CBC: Recent Labs  Lab 07/04/18 1438 07/09/18 1212 07/09/18 2224  WBC 2.9* 4.0  --   NEUTROABS 1.5  --   --   HGB 13.9 13.7 15.3  HCT 41.7 38.9* 45.0  MCV 89.4 84.2  --   PLT 205.0 166  --    Basic Metabolic Panel: Recent Labs  Lab 07/04/18 1438 07/09/18 1212 07/09/18 2224  NA 139 142 132*  K 3.0* 3.1* 3.9  CL 99 101 98  CO2 30 25  --   GLUCOSE 94 125* 115*  BUN 7 11 5*  CREATININE 0.64 0.71 0.60*  CALCIUM 9.4 9.0  --    GFR: Estimated Creatinine Clearance: 81 mL/min (A) (by C-G formula based on SCr of 0.6 mg/dL (L)). Liver Function Tests: Recent Labs  Lab 07/04/18 1438  AST 58*  ALT 34  ALKPHOS 56  BILITOT 0.7  PROT 7.8  ALBUMIN 4.5   No results for input(s): LIPASE, AMYLASE in the last 168 hours. No results for input(s): AMMONIA in the last 168 hours. Coagulation Profile: Recent Labs  Lab 07/09/18 2228  INR 1.0   Cardiac Enzymes: No results for input(s): CKTOTAL, CKMB, CKMBINDEX, TROPONINI in the last 168 hours. BNP (last 3 results) No results for input(s): PROBNP in the last 8760 hours. HbA1C: No results for input(s): HGBA1C in the last 72 hours. CBG: No results for input(s): GLUCAP in the last 168 hours. Lipid Profile: No results for input(s): CHOL, HDL, LDLCALC, TRIG, CHOLHDL, LDLDIRECT in the last 72 hours. Thyroid Function Tests: No results for input(s): TSH, T4TOTAL, FREET4, T3FREE, THYROIDAB in the last 72 hours. Anemia Panel: No results for input(s): VITAMINB12, FOLATE, FERRITIN, TIBC, IRON, RETICCTPCT in  the last 72 hours. Urine analysis:    Component Value Date/Time   COLORURINE COLORLESS (A) 07/09/2018 2228   APPEARANCEUR CLEAR 07/09/2018 2228   LABSPEC 1.006 07/09/2018 2228   PHURINE 7.0 07/09/2018 2228   GLUCOSEU NEGATIVE 07/09/2018 2228   HGBUR NEGATIVE 07/09/2018 2228   BILIRUBINUR NEGATIVE 07/09/2018 2228   KETONESUR 20 (A) 07/09/2018 2228   PROTEINUR NEGATIVE 07/09/2018 2228   NITRITE NEGATIVE 07/09/2018 2228   LEUKOCYTESUR NEGATIVE 07/09/2018 2228    Radiological Exams on Admission: Dg Chest Port 1 View  Result Date: 07/10/2018 CLINICAL DATA:  53 year old male with fever. EXAM: PORTABLE CHEST 1 VIEW COMPARISON:  None. FINDINGS: The lungs are clear. There is no  pleural effusion pneumothorax. The cardiac silhouette is within normal limits. Mild atherosclerotic calcification of the aortic arch. No acute osseous pathology. IMPRESSION: No active disease. Electronically Signed   By: Elgie CollardArash  Radparvar M.D.   On: 07/10/2018 00:04    EKG: Independently reviewed.  Assessment/Plan Principal Problem:   Syncope Active Problems:   ELEVATED BP READING WITHOUT DX HYPERTENSION   Hyperthyroidism   Fever    1. Syncope - 1. Syncope pathway 2. Tele monitor 3. 2d echo 2. Fever - 1. Unclear source 2. BCx pending 3. Check HIV 4. Tylenol PRN 3. HTN - 1. BP in ED again elevated up to the 200 systolic range 2. Down to 160 after given 5mg  PO amlodipine 3. Will continue him on the PO amlodipine and coreg and monitor BP here in hospital 4. Will defer hyperaldosteronism testing to PCP (who already has this work up planned). 4. Hyperthyroidism - 1. Continue Tapazole 2. Thyroid numbers looked good on Thursday labs  DVT prophylaxis: Lovenox Code Status: Full Family Communication: No family in room Disposition Plan: Home after admit Consults called: None Admission status: Place in 20obs     Wagner Tanzi M. DO [facility]  How to contact the [facility] Attending or Consulting  provider 7A - 7P or covering provider during after hours 7P -7A, for this patient?  1. Check the care team in [facility] and look for a) attending/consulting [facility] provider listed and b) the [facility] team listed 2. Log into [email or web address]  Amion Physician Scheduling and messaging for groups and whole hospitals  On call and physician scheduling software for group practices, residents, hospitalists and other medical providers for call, clinic, rotation and shift schedules. On[employer] is a hospital-wide system for scheduling doctors and paging doctors on call. EasyPlot is for scientific plotting and data analysis.  [email or web address]  and use [facility]'s universal password to access. If you do not have the password, please contact the hospital operator.  3. Locate the [facility] provider you are looking for under [facility] and page to a number that you can be directly reached. 4. If you still have difficulty reaching the provider, please page the [facility] (Director on Call) for the Hospitalists listed on amion for assistance.  07/10/2018, 2:20 AM

## 2018-07-10 NOTE — ED Notes (Signed)
Ordered diet  

## 2018-07-10 NOTE — Progress Notes (Signed)
  Echocardiogram 2D Echocardiogram has been performed.  Hunter Mitchell 07/10/2018, 3:13 PM

## 2018-07-10 NOTE — Progress Notes (Signed)
Daily Nursing Note  Received report from ER RN. Introduced self to patient who was in no visible distress. VSS at the time of assessment. Seems to have a rather flat affect. Worked with physical therapy, noted to be a bit unsteady and get dizzy during assessment. On mIVF --> NS at 126m/hr. All patient needs met during the course of the day.   Imaging: MRI Brain (6/23): Normal brain.  CXR (6/23): No active disease.  TTE (6/23):   1. The left ventricle has normal systolic function, with an ejection fraction of 55-60%. The cavity size was normal. Left ventricular diastolic parameters were normal. No evidence of left ventricular regional wall motion abnormalities.  2. The right ventricle has normal systolic function. The cavity was normal. There is no increase in right ventricular wall thickness. Right ventricular systolic pressure could not be assessed.  3. No evidence of mitral valve stenosis.  4. The aortic valve is tricuspid. No stenosis of the aortic valve.  5. The aortic root, descending aorta and aortic arch are normal in size and structure.  DC Plan: Possible discharge tomorrow if remains stable.

## 2018-07-11 ENCOUNTER — Observation Stay (HOSPITAL_COMMUNITY): Payer: Managed Care, Other (non HMO)

## 2018-07-11 DIAGNOSIS — R55 Syncope and collapse: Secondary | ICD-10-CM | POA: Diagnosis not present

## 2018-07-11 LAB — COMPREHENSIVE METABOLIC PANEL
ALT: 42 U/L (ref 0–44)
AST: 73 U/L — ABNORMAL HIGH (ref 15–41)
Albumin: 3.6 g/dL (ref 3.5–5.0)
Alkaline Phosphatase: 47 U/L (ref 38–126)
Anion gap: 10 (ref 5–15)
BUN: 10 mg/dL (ref 6–20)
CO2: 28 mmol/L (ref 22–32)
Calcium: 9.2 mg/dL (ref 8.9–10.3)
Chloride: 98 mmol/L (ref 98–111)
Creatinine, Ser: 0.71 mg/dL (ref 0.61–1.24)
GFR calc Af Amer: >60 mL/min (ref >60)
GFR calc non Af Amer: >60 mL/min (ref >60)
Glucose, Bld: 92 mg/dL (ref 70–99)
Potassium: 3.5 mmol/L (ref 3.5–5.1)
Sodium: 136 mmol/L (ref 135–145)
Total Bilirubin: 1.8 mg/dL — ABNORMAL HIGH (ref 0.3–1.2)
Total Protein: 7 g/dL (ref 6.5–8.1)

## 2018-07-11 LAB — CBC
HCT: 39.1 % (ref 39.0–52.0)
Hemoglobin: 13.8 g/dL (ref 13.0–17.0)
MCH: 29.4 pg (ref 26.0–34.0)
MCHC: 35.3 g/dL (ref 30.0–36.0)
MCV: 83.2 fL (ref 80.0–100.0)
Platelets: 134 10*3/uL — ABNORMAL LOW (ref 150–400)
RBC: 4.7 MIL/uL (ref 4.22–5.81)
RDW: 13.3 % (ref 11.5–15.5)
WBC: 3.2 10*3/uL — ABNORMAL LOW (ref 4.0–10.5)
nRBC: 0 % (ref 0.0–0.2)

## 2018-07-11 LAB — TSH: TSH: 1.136 u[IU]/mL (ref 0.350–4.500)

## 2018-07-11 LAB — GLUCOSE, CAPILLARY
Glucose-Capillary: 83 mg/dL (ref 70–99)
Glucose-Capillary: 63 mg/dL — ABNORMAL LOW (ref 70–99)
Glucose-Capillary: 88 mg/dL (ref 70–99)

## 2018-07-11 LAB — MAGNESIUM: Magnesium: 1.9 mg/dL (ref 1.7–2.4)

## 2018-07-11 MED ORDER — THIAMINE HCL 100 MG PO TABS: 100.0000 mg | ORAL_TABLET | Freq: Every day | ORAL | 0 refills | Status: DC

## 2018-07-11 MED ORDER — AMLODIPINE BESYLATE 10 MG PO TABS
10.0000 mg | ORAL_TABLET | Freq: Every day | ORAL | Status: DC
  Administered 2018-07-11: 10 mg via ORAL
  Filled 2018-07-11: qty 1

## 2018-07-11 MED ORDER — AMLODIPINE BESYLATE 10 MG PO TABS: 10.0000 mg | ORAL_TABLET | Freq: Every day | ORAL | 0 refills | Status: DC

## 2018-07-11 NOTE — Discharge Instructions (Addendum)
Preventing Hypoglycemia Hypoglycemia occurs when the level of sugar (glucose) in the blood is too low. Hypoglycemia can happen in people who do or do not have diabetes (diabetes mellitus). It can develop quickly, and it can be a medical emergency. For most people with diabetes, a blood glucose level below 70 mg/dL (3.9 mmol/L) is considered hypoglycemia. Glucose is a type of sugar that provides the body's main source of energy. Certain hormones (insulin and glucagon) control the level of glucose in the blood. Insulin lowers blood glucose, and glucagon increases blood glucose. Hypoglycemia can result from having too much insulin in the bloodstream, or from not eating enough food that contains glucose. Your risk for hypoglycemia is higher:  If you take insulin or diabetes medicines to help lower your blood glucose or help your body make more insulin.  If you skip or delay a meal or snack.  If you are ill.  During and after exercise. You can prevent hypoglycemia by working with your health care provider to adjust your meal plan as needed and by taking other precautions. How can hypoglycemia affect me? Mild symptoms Mild hypoglycemia may not cause any symptoms. If you do have symptoms, they may include:  Hunger.  Anxiety.  Sweating and feeling clammy.  Dizziness or feeling light-headed.  Sleepiness.  Nausea.  Increased heart rate.  Headache.  Blurry vision.  Irritability.  Tingling or numbness around the mouth, lips, or tongue.  A change in coordination.  Restless sleep. If mild hypoglycemia is not recognized and treated, it can quickly become moderate or severe hypoglycemia. Moderate symptoms Moderate hypoglycemia can cause:  Mental confusion and poor judgment.  Behavior changes.  Weakness.  Irregular heartbeat. Severe symptoms Severe hypoglycemia is a medical emergency. It can cause:  Fainting.  Seizures.  Loss of consciousness (coma).  Death. What  nutrition changes can be made?  Work with your health care provider or diet and nutrition specialist (dietitian) to make a healthy meal plan that is right for you. Follow your meal plan carefully.  Eat meals at regular times.  If recommended by your health care provider, have snacks between meals.  Donot skip or delay meals or snacks. You can be at risk for hypoglycemia if you are not getting enough carbohydrates. What lifestyle changes can be made?   Work closely with your health care provider to manage your blood glucose. Make sure you know: ? Your goal blood glucose levels. ? How and when to check your blood glucose. ? The symptoms of hypoglycemia. It is important to treat it right away to keep it from becoming severe.  Do not drink alcohol on an empty stomach.  When you are ill, check your blood glucose more often than usual. Follow your sick day plan whenever you cannot eat or drink normally. Make this plan in advance with your health care provider.  Always check your blood glucose before, during, and after exercise. How is this treated? This condition can often be treated by immediately eating or drinking something that contains sugar, such as:  Fruit juice, 4-6 oz (120-150 mL).  Regular (not diet) soda, 4-6 oz (120-150 mL).  Low-fat milk, 4 oz (120 mL).  Several pieces of hard candy.  Sugar or honey, 1 Tbsp (15 mL). Treating hypoglycemia if you have diabetes If you are alert and able to swallow safely, follow the 15:15 rule:  Take 15 grams of a rapid-acting carbohydrate. Talk with your health care provider about how much you should take.  Rapid-acting options  include: ? Glucose pills (take 15 grams). ? 6-8 pieces of hard candy. ? 4-6 oz (120-150 mL) of fruit juice. ? 4-6 oz (120-150 mL) of regular (not diet) soda.  Check your blood glucose 15 minutes after you take the carbohydrate.  If the repeat blood glucose level is still at or below 70 mg/dL (3.9 mmol/L),  take 15 grams of a carbohydrate again.  If your blood glucose level does not increase above 70 mg/dL (3.9 mmol/L) after 3 tries, seek emergency medical care.  After your blood glucose level returns to normal, eat a meal or a snack within 1 hour. Treating severe hypoglycemia Severe hypoglycemia is when your blood glucose level is at or below 54 mg/dL (3 mmol/L). Severe hypoglycemia is a medical emergency. Get medical help right away. If you have severe hypoglycemia and you cannot eat or drink, you may need an injection of glucagon. A family member or close friend should learn how to check your blood glucose and how to give you a glucagon injection. Ask your health care provider if you need to have an emergency glucagon injection kit available. Severe hypoglycemia may need to be treated in a hospital. The treatment may include getting glucose through an IV. You may also need treatment for the cause of your hypoglycemia. Where to find more information  American Diabetes Association: www.diabetes.[employer] of Diabetes and Digestive and Kidney Diseases: [employer] Contact a health care provider if:  You have problems keeping your blood glucose in your target range.  You have frequent episodes of hypoglycemia. Get help right away if:  You continue to have hypoglycemia symptoms after eating or drinking something containing glucose.  Your blood glucose level is at or below 54 mg/dL (3 mmol/L).  You faint.  You have a seizure. These symptoms may represent a serious problem that is an emergency. Do not wait to see if the symptoms will go away. Get medical help right away. Call your local emergency services (911 in the [place] Summary  Know the symptoms of hypoglycemia, and when you are at risk for it (such as during exercise or when you are sick). Check your blood glucose often when you are at risk for hypoglycemia.  Hypoglycemia can develop quickly, and it can be dangerous  if it is not treated right away. If you have a history of severe hypoglycemia, make sure you know how to use your glucagon injection kit.  Make sure you know how to treat hypoglycemia. Keep a carbohydrate snack available when you may be at risk for hypoglycemia. This information is not intended to replace advice given to you by your health care provider. Make sure you discuss any questions you have with your health care provider. Document Released: 08/31/2016 Document Revised: 06/26/2017 Document Reviewed: 08/31/2016 Elsevier Interactive Patient Education  2019 [place].   Potassium Content of Foods  Potassium is a mineral found in many foods and drinks. It affects how the heart works, and helps keep fluids and minerals balanced in the body. The amount of potassium you need each day depends on your age and any medical conditions you may have. Talk to your health care provider or dietitian about how much potassium you need. The following lists of foods provide the general serving size for foods and the approximate amount of potassium in each serving, listed in milligrams (mg). Actual values may vary depending on the product and how it is processed. High in potassium The following foods and beverages have 200 mg  or more of potassium per serving:  Apricots (raw) - 2 have 200 mg of potassium.  Apricots (dry) - 5 have 200 mg of potassium.  Artichoke - 1 medium has 345 mg of potassium.  Avocado -  fruit has 245 mg of potassium.  Banana - 1 medium fruit has 425 mg of potassium.  [place] or baked beans (canned) -  cup has 280 mg of potassium.  White beans (canned) -  cup has 595 mg potassium.  Beef roast - 3 oz has 320 mg of potassium.  Ground beef - 3 oz has 270 mg of potassium.  Beets (raw or cooked) -  cup has 260 mg of potassium.  Bran muffin - 2 oz has 300 mg of potassium.  Broccoli (cooked) -  cup has 230 mg of potassium.  Brussels sprouts -  cup has 250 mg of  potassium.  Cantaloupe -  cup has 215 mg of potassium.  Cereal, 100% bran -  cup has 200-400 mg of potassium.  Cheeseburger -1 single fast food burger has 225-400 mg of potassium.  Chicken - 3 oz has 220 mg of potassium.  Clams (canned) - 3 oz has 535 mg of potassium.  Crab - 3 oz has 225 mg of potassium.  Dates - 5 have 270 mg of potassium.  Dried beans and peas -  cup has 300-475 mg of potassium.  Figs (dried) - 2 have 260 mg of potassium.  Fish (halibut, tuna, cod, snapper) - 3 oz has 480 mg of potassium.  Fish (salmon, haddock, swordfish, perch) - 3 oz has 300 mg of potassium.  Fish (tuna, canned) - 3 oz has 200 mg of potassium.  [place] fries (fast food) - 3 oz has 470 mg of potassium.  Granola with fruit and nuts -  cup has 200 mg of potassium.  Grapefruit juice -  cup has 200 mg of potassium.  Honeydew melon -  cup has 200 mg of potassium.  Kale (raw) - 1 cup has 300 mg of potassium.  Kiwi - 1 medium fruit has 240 mg of potassium.  Kohlrabi, rutabaga, parsnips -  cup has 280 mg of potassium.  Lentils -  cup has 365 mg of potassium.  Mango - 1 each has 325 mg of potassium.  Milk (nonfat, low-fat, whole, buttermilk) - 1 cup has 350-380 mg of potassium.  Milk (chocolate) - 1 cup has 420 mg of potassium  Molasses - 1 Tbsp has 295 mg of potassium.  Mushrooms -  cup has 280 mg of potassium.  Nectarine - 1 each has 275 mg of potassium.  Nuts (almonds, peanuts, hazelnuts, [place], cashew, mixed) - 1 oz has 200 mg of potassium.  Nuts (pistachios) - 1 oz has 295 mg of potassium.  Orange - 1 fruit has 240 mg of potassium.  Orange juice -  cup has 235 mg of potassium.  Papaya -  medium fruit has 390 mg of potassium.  Peanut butter (chunky) - 2 Tbsp has 240 mg of potassium.  Peanut butter (smooth) - 2 Tbsp has 210 mg of potassium.  Pear - 1 medium (200 mg) of potassium.  Pomegranate - 1 whole fruit has 400 mg of potassium.  Pomegranate juice  -  cup has 215 mg of potassium.  Pork - 3 oz has 350 mg of potassium.  Potato chips (salted) - 1 oz has 465 mg of potassium.  Potato (baked with skin) - 1 medium has 925 mg of potassium.  Potato (boiled) -  cup has 255 mg of potassium.  Potato (Mashed) -  cup has 330 mg of potassium.  Prune juice -  cup has 370 mg of potassium.  Prunes - 5 have 305 mg of potassium.  Pudding (chocolate) -  cup has 230 mg of potassium.  Pumpkin (canned) -  cup has 250 mg of potassium.  Raisins (seedless) -  cup has 270 mg of potassium.  Seeds (sunflower or pumpkin) - 1 oz has 240 mg of potassium.  Soy milk - 1 cup has 300 mg of potassium.  Spinach (cooked) - 1/2 cup has 420 mg of potassium.  Spinach (canned) -  cup has 370 mg of potassium.  Sweet potato (baked with skin) - 1 medium has 450 mg of potassium.  Swiss chard -  cup has 480 mg of potassium.  Tomato or vegetable juice -  cup has 275 mg of potassium.  Tomato (sauce or puree) -  cup has 400-550 mg of potassium.  Tomato (raw) - 1 medium has 290 mg of potassium.  Tomato (canned) -  cup has 200-300 mg of potassium.  [place] - 3 oz has 250 mg of potassium.  Wheat germ - 1 oz has 250 mg of potassium.  Winter squash -  cup has 250 mg of potassium.  Yogurt (plain or fruited) - 6 oz has 260-435 mg of potassium.  Zucchini -  cup has 220 mg of potassium. Moderate in potassium The following foods and beverages have 50-200 mg of potassium per serving:  Apple - 1 fruit has 150 mg of potassium  Apple juice -  cup has 150 mg of potassium  Applesauce -  cup has 90 mg of potassium  Apricot nectar -  cup has 140 mg of potassium  Asparagus (small spears) -  cup has 155 mg of potassium  Asparagus (large spears) - 6 have 155 mg of potassium  Bagel (cinnamon raisin) - 1 four-inch bagel has 130 mg of potassium  Bagel (egg or plain) - 1 four- inch bagel has 70 mg of potassium  Beans (green) -  cup has 90 mg of  potassium  Beans (yellow) -  cup has 190 mg of potassium  Beer, regular - 12 oz has 100 mg of potassium  Beets (canned) -  cup has 125 mg of potassium  Blackberries -  cup has 115 mg of potassium  Blueberries -  cup has 60 mg of potassium  Bread (whole wheat) - 1 slice has 70 mg of potassium  Broccoli (raw) -  cup has 145 mg of potassium  Cabbage -  cup has 150 mg of potassium  Carrots (cooked or raw) -  cup has 180 mg of potassium  Cauliflower (raw) -  cup has 150 mg of potassium  Celery (raw) -  cup has 155 mg of potassium  Cereal, bran flakes -  cup has 120-150 mg of potassium  Cheese (cottage) -  cup has 110 mg of potassium  Cherries - 10 have 150 mg of potassium  Chocolate - 1 oz bar has 165 mg of potassium  Coffee (brewed) - 6 oz has 90 mg of potassium  Corn -  cup or 1 ear has 195 mg of potassium  Cucumbers -  cup has 80 mg of potassium  Egg - 1 large egg has 60 mg of potassium  Eggplant -  cup has 60 mg of potassium  Endive (raw) -  cup has 80 mg of potassium  English muffin - 1 has  65 mg of potassium  Fish (ocean perch) - 3 oz has 192 mg of potassium  Frankfurter, beef or pork - 1 has 75 mg of potassium  Fruit cocktail -  cup has 115 mg of potassium  Grape juice -  cup has 170 mg of potassium  Grapefruit -  fruit has 175 mg of potassium  Grapes -  cup has 155 mg of potassium  Greens: kale, turnip, collard -  cup has 110-150 mg of potassium  Ice cream or frozen yogurt (chocolate) -  cup has 175 mg of potassium  Ice cream or frozen yogurt (vanilla) -  cup has 120-150 mg of potassium  Lemons, limes - 1 each has 80 mg of potassium  Lettuce - 1 cup has 100 mg of potassium  Mixed vegetables -  cup has 150 mg of potassium  Mushrooms, raw -  cup has 110 mg of potassium  Nuts (walnuts, pecans, or macadamia) - 1 oz has 125 mg of potassium  Oatmeal -  cup has 80 mg of potassium  Okra -  cup has 110 mg of  potassium  Onions -  cup has 120 mg of potassium  Peach - 1 has 185 mg of potassium  Peaches (canned) -  cup has 120 mg of potassium  Pears (canned) -  cup has 120 mg of potassium  Peas, green (frozen) -  cup has 90 mg of potassium  Peppers (Green) -  cup has 130 mg of potassium  Peppers (Red) -  cup has 160 mg of potassium  Pineapple juice -  cup has 165 mg of potassium  Pineapple (fresh or canned) -  cup has 100 mg of potassium  Plums - 1 has 105 mg of potassium  Pudding, vanilla -  cup has 150 mg of potassium  Raspberries -  cup has 90 mg of potassium  Rhubarb -  cup has 115 mg of potassium  Rice, wild -  cup has 80 mg of potassium  Shrimp - 3 oz has 155 mg of potassium  Spinach (raw) - 1 cup has 170 mg of potassium  Strawberries -  cup has 125 mg of potassium  Summer squash -  cup has 175-200 mg of potassium  Swiss chard (raw) - 1 cup has 135 mg of potassium  Tangerines - 1 fruit has 140 mg of potassium  Tea, brewed - 6 oz has 65 mg of potassium  Turnips -  cup has 140 mg of potassium  Watermelon -  cup has 85 mg of potassium  Wine (Red, table) - 5 oz has 180 mg of potassium  Wine (White, table) - 5 oz 100 mg of potassium Low in potassium The following foods and beverages have less than 50 mg of potassium per serving.  Bread (white) - 1 slice has 30 mg of potassium  Carbonated beverages - 12 oz has less than 5 mg of potassium  Cheese - 1 oz has 20-30 mg of potassium  Cranberries -  cup has 45 mg of potassium  Cranberry juice cocktail -  cup has 20 mg of potassium  Fats and oils - 1 Tbsp has less than 5 mg of potassium  Hummus - 1 Tbsp has 32 mg of potassium  Nectar (papaya, mango, or pear) -  cup has 35 mg of potassium  Rice (white or brown) -  cup has 50 mg of potassium  Spaghetti or macaroni (cooked) -  cup has 30 mg of potassium  Tortilla, flour or  corn - 1 has 50 mg of potassium  Waffle - 1 four-inch waffle has  50 mg of potassium  Water chestnuts -  cup has 40 mg of potassium Summary  Potassium is a mineral found in many foods and drinks. It affects how the heart works, and helps keep fluids and minerals balanced in the body.  The amount of potassium you need each day depends on your age and any existing medical conditions you may have. Your health care provider or dietitian may recommend an amount of potassium that you should have each day. This information is not intended to replace advice given to you by your health care provider. Make sure you discuss any questions you have with your health care provider. Document Released: 08/17/2004 Document Revised: 03/30/2016 Document Reviewed: 03/30/2016 Elsevier Interactive Patient Education  2019 [place].   Hypokalemia Hypokalemia means that the amount of potassium in the blood is lower than normal.Potassium is a chemical that helps regulate the amount of fluid in the body (electrolyte). It also stimulates muscle tightening (contraction) and helps nerves work properly.Normally, most of the bodys potassium is inside of cells, and only a very small amount is in the blood. Because the amount in the blood is so small, minor changes to potassium levels in the blood can be life-threatening. What are the causes? This condition may be caused by:  Antibiotic medicine.  Diarrhea or vomiting. Taking too much of a medicine that helps you have a bowel movement (laxative) can cause diarrhea and lead to hypokalemia.  Chronic kidney disease (CKD).  Medicines that help the body get rid of excess fluid (diuretics).  Eating disorders, such as bulimia.  Low magnesium levels in the body.  Sweating a lot. What are the signs or symptoms? Symptoms of this condition include:  Weakness.  Constipation.  Fatigue.  Muscle cramps.  Mental confusion.  Skipped heartbeats or irregular heartbeat (palpitations).  Tingling or numbness. How is this  diagnosed? This condition is diagnosed with a blood test. How is this treated? Hypokalemia can be treated by taking potassium supplements by mouth or adjusting the medicines that you take. Treatment may also include eating more foods that contain a lot of potassium. If your potassium level is very low, you may need to get potassium through an IV tube in one of your veins and be monitored in the hospital. Follow these instructions at home:   Take over-the-counter and prescription medicines only as told by your health care provider. This includes vitamins and supplements.  Eat a healthy diet. A healthy diet includes fresh fruits and vegetables, whole grains, healthy fats, and lean proteins.  If instructed, eat more foods that contain a lot of potassium, such as: ? Nuts, such as peanuts and pistachios. ? Seeds, such as sunflower seeds and pumpkin seeds. ? Peas, lentils, and lima beans. ? Whole grain and bran cereals and breads. ? Fresh fruits and vegetables, such as apricots, avocado, bananas, cantaloupe, kiwi, oranges, tomatoes, asparagus, and potatoes. ? Orange juice. ? Tomato juice. ? Red meats. ? Yogurt.  Keep all follow-up visits as told by your health care provider. This is important. Contact a health care provider if:  You have weakness that gets worse.  You feel your heart pounding or racing.  You vomit.  You have diarrhea.  You have diabetes (diabetes mellitus) and you have trouble keeping your blood sugar (glucose) in your target range. Get help right away if:  You have chest pain.  You have shortness of  breath.  You have vomiting or diarrhea that lasts for more than 2 days.  You faint. This information is not intended to replace advice given to you by your health care provider. Make sure you discuss any questions you have with your health care provider. Document Released: 01/03/2005 Document Revised: 08/22/2015 Document Reviewed: 08/22/2015 Elsevier Interactive  Patient Education  2019 [employer] Syncope Syncope is when you pass out (faint) for a short time. It is caused by a sudden decrease in blood flow to the brain. Signs that you may be about to pass out include:  Feeling dizzy or light-headed.  Feeling sick to your stomach (nauseous).  Seeing all white or all black.  Having cold, clammy skin. If you pass out, get help right away. Call your local emergency services (911 in the [place] Do not drive yourself to the hospital. Follow these instructions at home: Watch for any changes in your symptoms. Take these actions to stay safe and help with your symptoms: Lifestyle  Do not drive, use machinery, or play sports until your doctor says it is okay.  Do not drink alcohol.  Do not use any products that contain nicotine or tobacco, such as cigarettes and e-cigarettes. If you need help quitting, ask your doctor.  Drink enough fluid to keep your pee (urine) pale yellow. General instructions  Take over-the-counter and prescription medicines only as told by your doctor.  If you are taking blood pressure or heart medicine, sit up and stand up slowly. Spend a few minutes getting ready to sit and then stand. This can help you feel less dizzy.  Have someone stay with you until you feel stable.  If you start to feel like you might pass out, lie down right away and raise (elevate) your feet above the level of your heart. Breathe deeply and steadily. Wait until all of the symptoms are gone.  Keep all follow-up visits as told by your doctor. This is important. Get help right away if:  You have a very bad headache.  You pass out once or more than once.  You have pain in your chest, belly, or back.  You have a very fast or uneven heartbeat (palpitations).  It hurts to breathe.  You are bleeding from your mouth or your bottom (rectum).  You have black or tarry poop (stool).  You have jerky movements that you cannot control  (seizure).  You are confused.  You have trouble walking.  You are very weak.  You have vision problems. These symptoms may be an emergency. Do not wait to see if the symptoms will go away. Get medical help right away. Call your local emergency services (911 in the [place] Do not drive yourself to the hospital. Summary  Syncope is when you pass out (faint) for a short time. It is caused by a sudden decrease in blood flow to the brain.  Signs that you may be about to faint include feeling dizzy, light-headed, or sick to your stomach, seeing all white or all black, or having cold, clammy skin.  If you start to feel like you might pass out, lie down right away and raise (elevate) your feet above the level of your heart. Breathe deeply and steadily. Wait until all of the symptoms are gone. This information is not intended to replace advice given to you by your health care provider. Make sure you discuss any questions you have with your health care provider. Document Released: 06/22/2007 Document Revised: 02/15/2017 Document Reviewed: 02/15/2017  Elsevier Interactive Patient Education  [employer].   Near-Syncope Near-syncope is when you suddenly get weak or dizzy, or you feel like you might pass out (faint). This is due to a lack of blood flow to the brain. During an episode of near-syncope, you may:  Feel dizzy or light-headed.  Feel sick to your stomach (nauseous).  See all white or all black.  Have cold, clammy skin. This condition is caused by a sudden decrease in blood flow to the brain. This decrease can result from various causes, but most of those causes are not dangerous. However, near-syncope may be a sign of a serious medical problem, so it is important to seek medical care. Follow these instructions at home: Pay attention to any changes in your symptoms. Take these actions to help with your condition:  Have someone stay with you until you feel stable.  Talk with your  doctor about your symptoms. You may need to have testing to understand the cause of your near-syncope.  Do not drive, use machinery, or play sports until your doctor says it is okay.  Keep all follow-up visits as told by your doctor. This is important.  If you start to feel like you might pass out, lie down right away and raise (elevate) your feet above the level of your heart. Breathe deeply and steadily. Wait until all of the symptoms are gone.  Drink enough fluid to keep your pee (urine) pale yellow. Medicines  If you are taking blood pressure or heart medicine, get up slowly and spend many minutes getting ready to sit and then stand. This can help with dizziness.  Take over-the-counter and prescription medicines only as told by your doctor. Get help right away if you:  Have a seizure.  Have pain in your: ? Chest. ? Tummy, ? Back.  Faint.  Have a bad headache.  Are bleeding from your mouth or butt.  Have black or tarry poop (stool).  Have a very fast or uneven heartbeat (palpitations).  Are confused.  Have trouble walking.  Are very weak.  Have trouble seeing. These symptoms may represent a serious problem that is an emergency. Do not wait to see if your symptoms will go away. Get medical help right away. Call your local emergency services (911 in the [place] Do not drive yourself to the hospital. Summary  Near-syncope is when you suddenly get weak or dizzy, or you feel like you might pass out.  This condition is caused by a lack of blood flow to the brain.  Near-syncope may be a sign of a serious medical problem, so it is important to seek medical care. This information is not intended to replace advice given to you by your health care provider. Make sure you discuss any questions you have with your health care provider. Document Released: 06/22/2007 Document Revised: 08/29/2017 Document Reviewed: 09/17/2014 Elsevier Interactive Patient Education  2019 [employer].

## 2018-07-11 NOTE — Progress Notes (Signed)
Pt blood sugar was 63 this morning. Pt said he does not have a history of DM. 2 glasses of orange juice were given.

## 2018-07-11 NOTE — Progress Notes (Signed)
Physical Therapy Treatment Patient Details Name: Hunter Mitchell MRN: [id number] DOB: 05-03-1965 Today's Date: 07/11/2018    History of Present Illness 53 yo male admitted this morning with syncope at work. MRI negative. ECHO pending. Pt with newly diagnosed asymptomatic HTN. Recently placed on medication a few days ago. Per wife pt also with recent alcohol abuse.    PT Comments    Patient reports feeling better from this morning, ambulating well with therapy today no LOB, no dizziness reported after 300'. BP 140/78 after gait, Independent with mobility today, PT will sign off at this time.      Follow Up Recommendations  No PT follow up;Supervision/Assistance - 24 hour     Equipment Recommendations  None recommended by PT    Recommendations for Other Services       Precautions / Restrictions Precautions Precautions: Fall Precaution Comments: lightheaded with ambulation Restrictions Weight Bearing Restrictions: No    Mobility  Bed Mobility Overal bed mobility: Modified Independent             General bed mobility comments: HOB flat, no difficulty, no physical assist  Transfers Overall transfer level: Needs assistance Equipment used: None Transfers: Sit to/from Stand Sit to Stand: Supervision         General transfer comment: supervision for safety due to recent syncopal episode  Ambulation/Gait Ambulation/Gait assistance: Independent Gait Distance (Feet): 300 Feet Assistive device: None       General Gait Details: no dizziness reported, no LOB, pt with BP 140/79 after ambulation    Stairs             Wheelchair Mobility    Modified Rankin (Stroke Patients Only)       Balance                                 Standardized Balance Assessment Standardized Balance Assessment : Dynamic Gait Index          Cognition Arousal/Alertness: Awake/alert Behavior During Therapy: Flat affect Overall Cognitive Status: No  family/caregiver present to determine baseline cognitive functioning                                 General Comments: improving cognition       Exercises      General Comments        Pertinent Vitals/Pain Pain Assessment: No/denies pain    Home Living                      Prior Function            PT Goals (current goals can now be found in the care plan section) Acute Rehab PT Goals Patient Stated Goal: get better PT Goal Formulation: With patient Time For Goal Achievement: 07/24/18 Potential to Achieve Goals: Good Progress towards PT goals: Goals met/education completed, patient discharged from PT    Frequency    Min 3X/week      PT Plan      Co-evaluation              AM-PAC PT "6 Clicks" Mobility   Outcome Measure  Help needed turning from your back to your side while in a flat bed without using bedrails?: None Help needed moving from lying on your back to sitting on the side of a flat bed without using bedrails?: None  Help needed moving to and from a bed to a chair (including a wheelchair)?: A Little Help needed standing up from a chair using your arms (e.g., wheelchair or bedside chair)?: A Little Help needed to walk in hospital room?: A Little Help needed climbing 3-5 steps with a railing? : A Little 6 Click Score: 20    End of Session Equipment Utilized During Treatment: Gait belt Activity Tolerance: Patient tolerated treatment well Patient left: in bed;with call bell/phone within reach Nurse Communication: Mobility status PT Visit Diagnosis: Unsteadiness on feet (R26.81);Muscle weakness (generalized) (M62.81);Difficulty in walking, not elsewhere classified (R26.2)     Time: 3276-1470 PT Time Calculation (min) (ACUTE ONLY): 20 min  Charges:  $Gait Training: 8-22 mins                     Reinaldo Berber, PT, DPT Acute Rehabilitation Services Pager: [phone] Office: [phone]     Reinaldo Berber 07/11/2018, 9:36 AM

## 2018-07-11 NOTE — Discharge Summary (Addendum)
Discharge Summary  Hunter Mitchell ZOX:[id number] DOB: Aug 25, 1965  PCP: Hunter Mitchell  Admit date: 07/09/2018 Discharge date: 07/11/2018  Time spent: 35 minutes  Recommendations for Outpatient Follow-up:  1. Follow-up with GI [facility] outpatient [facility] Barron [facility] on 07/13/2018 at 3:30 PM.  This will be an initial virtual visit. 2.   Completely abstain from alcohol use. 3.   Follow-up with your primary care provider.  Discharge Diagnoses:  Active Hospital Problems   Diagnosis Date Noted   Syncope 07/10/2018   Fever 07/10/2018   Hyperthyroidism 12/04/2014   ELEVATED BP READING WITHOUT DX HYPERTENSION 01/05/2010    Resolved Hospital Problems  No resolved problems to display.    Discharge Condition: Stable  Diet recommendation: Resume previous diet  Vitals:   07/11/18 0503 07/11/18 0732  BP: (!) 146/84 (!) 142/93  Pulse: 61 66  Resp:    Temp:  98.4 F (36.9 C)  SpO2:  100%    History of present illness:  Hunter NabHenry J Mitchell is a 53 y.o. male with medical history significant of hyperthyroidism.  Went for routine physical last Thursday.  Found to have asymptomatic HTN and started on amlodipine 5mg  daily and coreg 6.25mg  BID. Initially felt well after taking meds, but on Sat had sudden onset of lightheadedness and syncope while talking to coworker.  This morning woke up very thirsty.  Sent to ED for evaluation.  Didn't take BP meds today.  PCP also noted hypokalemia on labs on Thurs and is planning work up for hyperaldosteronism, testing pending.  Does drink heavily every other day he says.  Never had withdrawal symptoms. Patient works 11 hours a day Monday through Friday.  He drinks alcohol as soon as he gets off work.    Patient with chills and tremors in ED.  Noted to have Tm 100.2.  CXR, UA, neg for source.  Patient denies: Cough, SOB, neck stiffness, headache (had mild headache yesterday but none now), abd pain, N/V/D.  07/11/18: Patient seen  and examined at his bedside.  Reports mild dizziness earlier this morning.  Hypoglycemia also noted this a.m. with fasting blood sugar 63.  Work-up thus far has been negative.  MRI brain unremarkable for any acute intracranial findings, chest x-ray no active disease, TTE LVEF 55 to 60% no evidence of mitral valve stenosis.    Mildly elevated AST and T bilirubin on admission.  CT abdomen and pelvis ordered due to significant weight loss as reported by the patient.  An appointment has been made to follow-up with GI outpatient Dr. Barron Mitchell in [facility] for positive hep B core Ab from 10/10/2016.  He will have an initial virtual visit on 07/13/2018 at 3:30 PM.    Hospital Course:  Principal Problem:   Syncope Active Problems:   ELEVATED BP READING WITHOUT DX HYPERTENSION   Hyperthyroidism   Fever   Syncope likely secondary to dehydration in the setting of poor oral intake Syncope work-up unrevealing thus far MRI brain unremarkable 2D echo unremarkable Orthostatic vital signs unremarkable Hemoglobin stable at 13.7 on 07/10/2018 PT assessed with no further recommendations Received IV fluid hydration normal saline at 100 cc/h Advised to avoid dehydration  Elevated AST Reactive hep B core antibody on 10/10/2016 AST trending up 23 from 71 yesterday Total bilirubin trending up 1.8 from 1.5 yesterday Appointment made to follow-up with GI outpatient [facility] Dr. Barron Mitchell on Friday 2020 at 3:30 PM.  This will be a virtual visit.  Elevated T bilirubin No sign of  active bleeding Last hemoglobin 13.7 with platelet of 153 while on IV fluid hydration. Follow-up with GI outpatient Repeat CMP on Monday  Alcohol abuse  Patient advised to seek help from alcohol Anonymous he declines. States he can quit drinking alcohol on his own  Dehydration in the setting of poor oral intake and alcohol abuse Advised on the importance of staying hydrated Alcohol cessation counseling at  bedside  Hypoglycemia One episode of fasting hypoglycemia with blood sugar of 63 Patient advised to eat frequent meals Follow-up with your primary care provider  Unintentional weight loss Patient admits to having unintentional weight loss BMI 17 CT abdomen and pelvis ordered to further assess Advised to increase oral protein calorie intake. Follow-up with GI and PCP  Hypokalemia, resolved post repletion Presented with potassium of 3.1 Repleted Potassium on 07/11/2018 was 3.5 with magnesium 1.9.   Procedures:  None  Consultations:  None  Discharge Exam: BP (!) 142/93 (BP Location: Right Arm)    Pulse 66    Temp 98.4 F (36.9 C) (Oral)    Resp 18    SpO2 100%   General: 53 y.o. year-old male Emaciated in no acute distress.  Alert and oriented x3.  Cardiovascular: Regular rate and rhythm with no rubs or gallops.  No thyromegaly or JVD noted.    Respiratory: Clear to auscultation with no wheezes or rales. Good inspiratory effort.  Abdomen: Soft nontender nondistended with normal bowel sounds x4 quadrants.  Musculoskeletal: No lower extremity edema. 2/4 pulses in all 4 extremities.  Psychiatry: Mood is appropriate for condition and setting  Discharge Instructions You were cared for by a hospitalist during your hospital stay. If you have any questions about your discharge medications or the care you received while you were in the hospital after you are discharged, you can call the unit and asked to speak with the hospitalist on call if the hospitalist that took care of you is not available. Once you are discharged, your primary care physician will handle any further medical issues. Please note that NO REFILLS for any discharge medications will be authorized once you are discharged, as it is imperative that you return to your primary care physician (or establish a relationship with a primary care physician if you do not have one) for your aftercare needs so that they can reassess  your need for medications and monitor your lab values.   Allergies as of 07/11/2018   No Known Allergies     Medication List    STOP taking these medications   ibuprofen 200 MG tablet Commonly known as: ADVIL     TAKE these medications   amLODipine 10 MG tablet Commonly known as: NORVASC Take 1 tablet (10 mg total) by mouth at bedtime. Start taking on: July 12, 2018 What changed:   medication strength  how much to take  when to take this   b complex vitamins tablet Take 1 tablet by mouth daily.   carvedilol 6.25 MG tablet Commonly known as: COREG Take 1 tablet (6.25 mg total) by mouth 2 (two) times daily with a meal. What changed: when to take this   DHA PO Take 1 tablet by mouth daily.   FISH OIL PO Take 1 capsule by mouth daily.   methimazole 5 MG tablet Commonly known as: TAPAZOLE Take 1 tablet (5 mg total) by mouth 3 (three) times a week. What changed: when to take this   multivitamin with minerals Tabs tablet Take 1 tablet by mouth daily.  thiamine 100 MG tablet Take 1 tablet (100 mg total) by mouth daily. Start taking on: July 12, 2018   VITAMIN C PO Take 1 tablet by mouth daily.      No Known Allergies Follow-up Information    Hunter Plump, Mitchell.   Specialty: Internal Medicine Contact information: [address] [place] [address] [phone]        Shellia Cleverly, DO. Call in 1 day(s).   Specialty: Gastroenterology Why: Please call to confirm post hospital follow-up appointment on Friday, July 13, 2018 at 3:30 PM.  This will be a virtual visit. Contact information: [address] [place] [address] [phone]            The results of significant diagnostics from this hospitalization (including imaging, microbiology, ancillary and laboratory) are listed below for reference.    Significant Diagnostic Studies: Ct Abdomen Pelvis Wo Contrast  Result Date: 07/11/2018 CLINICAL DATA:   Unintended weight loss. Nonlocalized abdominal pain. EXAM: CT ABDOMEN AND PELVIS WITHOUT CONTRAST TECHNIQUE: Multidetector CT imaging of the abdomen and pelvis was performed following the standard protocol without IV contrast. COMPARISON:  None. FINDINGS: Lower chest: Solitary 3 mm pleural based nodule at the right lung base on image 9 of series 4. Otherwise normal. Hepatobiliary: No focal liver abnormality is seen. No gallstones, gallbladder wall thickening, or biliary dilatation. Pancreas: Unremarkable. No pancreatic ductal dilatation or surrounding inflammatory changes. Spleen: Normal in size without focal abnormality. Adrenals/Urinary Tract: Adrenal glands are unremarkable. Kidneys are normal, without renal calculi, focal lesion, or hydronephrosis. Bladder is unremarkable. Stomach/Bowel: Stomach is within normal limits. Appendix appears normal. No evidence of bowel wall thickening, distention, or inflammatory changes. Vascular/Lymphatic: No significant vascular findings are present. No enlarged abdominal or pelvic lymph nodes. Reproductive: Prostate is unremarkable. Other: No abdominal wall hernia or abnormality. No abdominopelvic ascites. Musculoskeletal: No acute abnormality. Moderate arthritis of the left hip. Mild right hip arthritis. Osteophytes fuse the superior aspect of the left sacroiliac joint. IMPRESSION: Benign-appearing abdomen and pelvis. Tiny pleural based nodule at the right lung base posteriorly. No follow-up needed if patient is low-risk. Non-contrast chest CT can be considered in 12 months if patient is high-risk. This recommendation follows the consensus statement: Guidelines for Management of Incidental Pulmonary Nodules Detected on CT Images: From the Fleischner Society 2017; Radiology 2017; 284:228-243. Electronically Signed   By: Francene Boyers M.D.   On: 07/11/2018 11:08   Mr Brain Wo Contrast  Result Date: 07/10/2018 CLINICAL DATA:  Central vertigo EXAM: MRI HEAD WITHOUT CONTRAST  TECHNIQUE: Multiplanar, multiecho pulse sequences of the brain and surrounding structures were obtained without intravenous contrast. COMPARISON:  None. FINDINGS: BRAIN: There is no acute infarct, acute hemorrhage or extra-axial collection. The midline structures are normal. The white matter signal is normal for the patient's age. The cerebral and cerebellar volume are age-appropriate. No hydrocephalus. Susceptibility-sensitive sequences show no chronic microhemorrhage or superficial siderosis. No midline shift or other mass effect. VASCULAR: The major intracranial arterial and venous sinus flow voids are normal. SKULL AND UPPER CERVICAL SPINE: Calvarial bone marrow signal is normal. There is no skull base mass. Visualized upper cervical spine and soft tissues are normal. SINUSES/ORBITS: No fluid levels or advanced mucosal thickening. No mastoid or middle ear effusion. The orbits are normal. IMPRESSION: Normal brain. Electronically Signed   By: Deatra Robinson M.D.   On: 07/10/2018 04:00   Dg Chest Port 1 View  Result Date: 07/10/2018 CLINICAL DATA:  53 year old male with fever. EXAM: PORTABLE CHEST 1 VIEW COMPARISON:  None. FINDINGS: The lungs are clear. There is no pleural effusion pneumothorax. The cardiac silhouette is within normal limits. Mild atherosclerotic calcification of the aortic arch. No acute osseous pathology. IMPRESSION: No active disease. Electronically Signed   By: Elgie CollardArash  Radparvar M.D.   On: 07/10/2018 00:04    Microbiology: Recent Results (from the past 240 hour(s))  SARS Coronavirus 2 (CEPHEID - Performed in [facility] lab), Hosp Order     Status: None   Collection Time: 07/09/18 11:43 PM   Specimen: Nasopharyngeal Swab  Result Value Ref Range Status   SARS Coronavirus 2 NEGATIVE NEGATIVE Final    Comment: (NOTE) If result is NEGATIVE SARS-CoV-2 target nucleic acids are NOT DETECTED. The SARS-CoV-2 RNA is generally detectable in upper and lower  respiratory specimens  during the acute phase of infection. The lowest  concentration of SARS-CoV-2 viral copies this assay can detect is 250  copies / mL. A negative result does not preclude SARS-CoV-2 infection  and should not be used as the sole basis for treatment or other  patient management decisions.  A negative result may occur with  improper specimen collection / handling, submission of specimen other  than nasopharyngeal swab, presence of viral mutation(s) within the  areas targeted by this assay, and inadequate number of viral copies  (<250 copies / mL). A negative result must be combined with clinical  observations, patient history, and epidemiological information. If result is POSITIVE SARS-CoV-2 target nucleic acids are DETECTED. The SARS-CoV-2 RNA is generally detectable in upper and lower  respiratory specimens dur ing the acute phase of infection.  Positive  results are indicative of active infection with SARS-CoV-2.  Clinical  correlation with patient history and other diagnostic information is  necessary to determine patient infection status.  Positive results do  not rule out bacterial infection or co-infection with other viruses. If result is PRESUMPTIVE POSTIVE SARS-CoV-2 nucleic acids MAY BE PRESENT.   A presumptive positive result was obtained on the submitted specimen  and confirmed on repeat testing.  While 2019 novel coronavirus  (SARS-CoV-2) nucleic acids may be present in the submitted sample  additional confirmatory testing may be necessary for epidemiological  and / or clinical management purposes  to differentiate between  SARS-CoV-2 and other Sarbecovirus currently known to infect humans.  If clinically indicated additional testing with an alternate test  methodology [phone]([id number]) is advised. The SARS-CoV-2 RNA is generally  detectable in upper and lower respiratory sp ecimens during the acute  phase of infection. The expected result is Negative. Fact Sheet for Patients:   BoilerBrush.com.cyhttps://www.fda.gov/media/136312/download Fact Sheet for Healthcare Providers: https://pope.com/https://www.fda.gov/media/136313/download This test is not yet approved or cleared by the [place] FDA and has been authorized for detection and/or diagnosis of SARS-CoV-2 by FDA under an Emergency Use Authorization (EUA).  This EUA will remain in effect (meaning this test can be used) for the duration of the COVID-19 declaration under Section 564(b)(1) of the Act, 21 U.S.C. section 360bbb-3(b)(1), unless the authorization is terminated or revoked sooner. Performed at [facility], [address] [place], [place] [id number]      Labs: Basic Metabolic Panel: Recent Labs  Lab 07/04/18 1438 07/09/18 1212 07/09/18 2224 07/10/18 0213 07/11/18 0541  NA 139 142 132* 132* 136  K 3.0* 3.1* 3.9 3.7 3.5  CL 99 101 98 93* 98  CO2 30 25  --  27 28  GLUCOSE 94 125* 115* 120*  92  BUN 7 11 5* 6 10  CREATININE 0.64 0.71 0.60* 0.72 0.71  CALCIUM 9.4 9.0  --  9.1 9.2  MG  --   --   --   --  1.9   Liver Function Tests: Recent Labs  Lab 07/04/18 1438 07/10/18 0213 07/11/18 0541  AST 58* 71* 73*  ALT 34 43 42  ALKPHOS 56 59 47  BILITOT 0.7 1.5* 1.8*  PROT 7.8 8.1 7.0  ALBUMIN 4.5 4.3 3.6   No results for input(s): LIPASE, AMYLASE in the last 168 hours. No results for input(s): AMMONIA in the last 168 hours. CBC: Recent Labs  Lab 07/04/18 1438 07/09/18 1212 07/09/18 2224 07/10/18 0213 07/11/18 0541  WBC 2.9* 4.0  --  3.9* 3.2*  NEUTROABS 1.5  --   --   --   --   HGB 13.9 13.7 15.3 13.7 13.8  HCT 41.7 38.9* 45.0 37.9* 39.1  MCV 89.4 84.2  --  81.9 83.2  PLT 205.0 166  --  153 134*   Cardiac Enzymes: No results for input(s): CKTOTAL, CKMB, CKMBINDEX, TROPONINI in the last 168 hours. BNP: BNP (last 3 results) No results for input(s): BNP in the last 8760 hours.  ProBNP (last 3 results) No results for input(s): PROBNP in the last 8760 hours.  CBG: Recent Labs  Lab 07/10/18 0638  07/11/18 0631 07/11/18 0734 07/11/18 0810  GLUCAP 76 83 63* 88       Signed:  Darlin Droparole N Geroldine Esquivias, Mitchell [facility] 07/11/2018, 11:27 AM

## 2018-07-12 ENCOUNTER — Ambulatory Visit (INDEPENDENT_AMBULATORY_CARE_PROVIDER_SITE_OTHER): Payer: Managed Care, Other (non HMO) | Admitting: Internal Medicine

## 2018-07-12 ENCOUNTER — Encounter: Payer: Self-pay | Admitting: Internal Medicine

## 2018-07-12 ENCOUNTER — Other Ambulatory Visit: Payer: Self-pay

## 2018-07-12 ENCOUNTER — Telehealth: Payer: Self-pay | Admitting: *Deleted

## 2018-07-12 DIAGNOSIS — E876 Hypokalemia: Secondary | ICD-10-CM

## 2018-07-12 DIAGNOSIS — R55 Syncope and collapse: Secondary | ICD-10-CM

## 2018-07-12 DIAGNOSIS — I10 Essential (primary) hypertension: Secondary | ICD-10-CM

## 2018-07-12 DIAGNOSIS — F1010 Alcohol abuse, uncomplicated: Secondary | ICD-10-CM

## 2018-07-12 MED ORDER — ESCITALOPRAM OXALATE 5 MG PO TABS: 5.0000 mg | ORAL_TABLET | Freq: Every day | ORAL | 1 refills | Status: DC

## 2018-07-12 NOTE — Progress Notes (Signed)
Subjective:    Patient ID: Hunter Mitchell, male    DOB: [birth year], 52 y.o.   MRN: [id number]  DOS:  07/12/2018 Type of visit - description: Virtual Visit via Video Note  I connected with@ on 07/14/18 at  1:20 PM EDT by a video enabled telemedicine application and verified that I am speaking with the correct person using two identifiers.   THIS ENCOUNTER IS A VIRTUAL VISIT DUE TO COVID-19 - PATIENT WAS NOT SEEN IN THE OFFICE. PATIENT HAS CONSENTED TO VIRTUAL VISIT / TELEMEDICINE VISIT   Location of patient: home  Location of provider: office  I discussed the limitations of evaluation and management by telemedicine and the availability of in person appointments. The patient expressed understanding and agreed to proceed.  History of Present Illness: TCM 7 The patient was admitted to the hospital, records reviewed. He was discharged yesterday. HTN: Currently on amlodipine only, carvedilol on hold. Blood pressure this morning 105/73. No further syncope   Review of Systems Denies fever chills No chest pain no difficulty breathing No edema No nausea, vomiting, diarrhea No headaches History of alcoholism: No alcohol in the last 5 days. No further near syncope.  Past Medical History:  Diagnosis Date  . Syncope 11-2009   ECHO 01-2010 normal    Past Surgical History:  Procedure Laterality Date  . NO PAST SURGERIES      Social History   Socioeconomic History  . Marital status: Married    Spouse name: Not on file  . Number of children: 3  . Years of education: Not on file  . Highest education level: Not on file  Occupational History  . Occupation: [employer]  . Financial resource strain: Not on file  . Food insecurity    Worry: Not on file    Inability: Not on file  . Transportation needs    Medical: Not on file    Non-medical: Not on file  Tobacco Use  . Smoking status: Never Smoker  . Smokeless tobacco: Never Used  Substance and Sexual  Activity  . Alcohol use: Yes    Alcohol/week: 21.0 standard drinks    Types: 21 Cans of beer per week    Comment: socially   . Drug use: No  . Sexual activity: Not on file  Lifestyle  . Physical activity    Days per week: Not on file    Minutes per session: Not on file  . Stress: Not on file  Relationships  . Social Herbalist on phone: Not on file    Gets together: Not on file    Attends religious service: Not on file    Active member of club or organization: Not on file    Attends meetings of clubs or organizations: Not on file    Relationship status: Not on file  . Intimate partner violence    Fear of current or ex partner: Not on file    Emotionally abused: Not on file    Physically abused: Not on file    Forced sexual activity: Not on file  Other Topics Concern  . Not on file  Social History Narrative   Household- pt , wife, a nephew (autistic)   Children x 3,biological, 4 step children         Allergies as of 07/12/2018   No Known Allergies     Medication List       Accurate as of July 12, 2018 11:59 PM. If you  have any questions, ask your nurse or doctor.        STOP taking these medications   carvedilol 6.25 MG tablet Commonly known as: COREG Stopped by: Willow OraJose Ulices Maack, MD     TAKE these medications   amLODipine 10 MG tablet Commonly known as: NORVASC Take 1 tablet (10 mg total) by mouth at bedtime.   b complex vitamins tablet Take 1 tablet by mouth daily.   DHA PO Take 1 tablet by mouth daily.   escitalopram 5 MG tablet Commonly known as: Lexapro Take 1 tablet (5 mg total) by mouth daily. Started by: Willow OraJose Joshawn Crissman, MD   FISH OIL PO Take 1 capsule by mouth daily.   methimazole 5 MG tablet Commonly known as: TAPAZOLE Take 1 tablet (5 mg total) by mouth 3 (three) times a week. What changed: when to take this   multivitamin with minerals Tabs tablet Take 1 tablet by mouth daily.   thiamine 100 MG tablet Take 1 tablet (100 mg total) by  mouth daily.   VITAMIN C PO Take 1 tablet by mouth daily.           Objective:   Physical Exam There were no vitals taken for this visit. This is a virtual video visit, the patient was alert oriented, no apparent distress, he was calm and collected.    Assessment     Assessment Hypertensive emergency 06/2018 Hyperthyroidism, per endo Syncope, normal echo 2012 Syncope 06/2018, work-up negative Alcohol abuse DX 06/2018  Plan:  Hypertensive urgency: See last visit, blood pressure was quite elevated, started amlodipine and carvedilol.  BP decreased, but subsequently  had a syncope and was admitted to the hospital. He was discharged yesterday on amlodipine, they increase the dose to 10 mg and ask him to stop carvedilol. BP today 105/73, in the low side. Plan: Continue amlodipine 10 mg, discontinue carvedilol but re start if needed.  Amb BPs, see message w/  instructions  Hypokalemia: Was found to have a low potassium in the context of hypertensive urgency.  Potassium was replenish at the hospital, will check a BMP , aldosterone, renin activity to rule out primary hyperaldosteronism.  Will check next week. Syncope: Admitted to the hospital and discharged yesterday, syncope was felt to be due to dehydration and poor oral intake, Brain MRI, echocardiogram normal.  Orthostatic vital signs unremarkable.  No anemia.    So far he is feeling well Alcoholism: The patient admits that he drinks daily, 2 to 3 beers and additional liquor shots.  No alcohol in the last 5 days, denies any shakiness. Advised patient that this is a lifelong illness, recommend AA, he verbalized understanding and is seriously thinking about it.  He is taking thiamine. Anxiety: Anxiety has been an issue for the patient, work-related, recommend a low-dose of Lexapro 5 mg daily. Elevated LFTs: + Hepatitis B core antibody 2018, they have set up a follow-up with GI. Plan: Labs next week RTC at 4 in person be sent for weeks     I discussed the assessment and treatment plan with the patient. The patient was provided an opportunity to ask questions and all were answered. The patient agreed with the plan and demonstrated an understanding of the instructions.   The patient was advised to call back or seek an in-person evaluation if the symptoms worsen or if the condition fails to improve as anticipated.

## 2018-07-12 NOTE — Telephone Encounter (Signed)
Transition Care Management Follow-up Telephone Call   Date discharged? 07/11/18   How have you been since you were released from the hospital? "I'm doing better"   Do you understand why you were in the hospital? yes   Do you understand the discharge instructions? yes   Where were you discharged to? Home with wife   Items Reviewed:  Medications reviewed: pt states thiamine was added to meds.  Allergies reviewed: yes  Dietary changes reviewed: yes  Referrals reviewed: yes   Functional Questionnaire:   Activities of Daily Living (ADLs):   He states they are independent in the following: ambulation, bathing and hygiene, feeding, continence, grooming, toileting and dressing States they require assistance with the following: na   Any transportation issues/concerns?: no   Any patient concerns? no   Confirmed importance and date/time of follow-up visits scheduled yes  Provider Appointment booked with  Confirmed with patient if condition begins to worsen call PCP or go to the ER.  Patient was given the office number and encouraged to call back with question or concerns.  : yes

## 2018-07-13 ENCOUNTER — Telehealth (INDEPENDENT_AMBULATORY_CARE_PROVIDER_SITE_OTHER): Payer: Managed Care, Other (non HMO) | Admitting: Gastroenterology

## 2018-07-13 ENCOUNTER — Encounter: Payer: Self-pay | Admitting: Gastroenterology

## 2018-07-13 ENCOUNTER — Other Ambulatory Visit: Payer: Self-pay

## 2018-07-13 VITALS — Ht 68.0 in | Wt 115.0 lb

## 2018-07-13 DIAGNOSIS — R634 Abnormal weight loss: Secondary | ICD-10-CM

## 2018-07-13 DIAGNOSIS — R740 Nonspecific elevation of levels of transaminase and lactic acid dehydrogenase [LDH]: Secondary | ICD-10-CM

## 2018-07-13 DIAGNOSIS — Z1211 Encounter for screening for malignant neoplasm of colon: Secondary | ICD-10-CM

## 2018-07-13 DIAGNOSIS — F1010 Alcohol abuse, uncomplicated: Secondary | ICD-10-CM

## 2018-07-13 DIAGNOSIS — Z205 Contact with and (suspected) exposure to viral hepatitis: Secondary | ICD-10-CM

## 2018-07-13 DIAGNOSIS — R7401 Elevation of levels of liver transaminase levels: Secondary | ICD-10-CM

## 2018-07-13 NOTE — Patient Instructions (Signed)
If you are age 51 or older, your body mass index should be between 23-30. Your Body mass index is 17.49 kg/m. If this is out of the aforementioned range listed, please consider follow up with your Primary Care Provider.  If you are age 85 or younger, your body mass index should be between 19-25. Your Body mass index is 17.49 kg/m. If this is out of the aformentioned range listed, please consider follow up with your Primary Care Provider.   It has been recommended to you by your physician that you have a(n) EGD?Colonoscopy completed. Per your request, we did not schedule the procedure(s) today. Please contact our office at [phone] should you decide to have the procedure completed.   Please go to the lab on level B at [facility] in [place] ([place])  to have your labs drawn.  Follow up in 3 months.   Thank you, Dr. Bryan Lemma

## 2018-07-13 NOTE — Progress Notes (Signed)
Chief Complaint: Hepatitis B, weight loss, elevated LAE's  Referring Provider:     Gerald DexterPaz, Jos E, MD   HPI:    Due to current restrictions/limitations of in-office visits due to the COVID-19 pandemic, this scheduled clinical appointment was converted to a telehealth virtual consultation using D[employer].  -Time of medical discussion: 21 minutes -The patient did consent to this virtual visit and is aware of possible charges through their insurance for this visit.  -Names of all parties present: Hunter Mitchell (patient), Doristine LocksVito Megumi Treaster, DO, [facility] ([facility]ician) -Patient location: Home -Physician location: Office  Hunter Mitchell is a 53 y.o. male referred to the [facility]ology Clinic for evaluation of + HBVcAb in 2018 and elevated LAE's.  Was admitted 6/22-24 with syncope.  MRI brain and 2D echo unremarkable.  Work-up largely nonrevealing.  No prior hx of known liver disease or known viral hepatitis.  Does not recall ever being told he had a positive hepatitis B test in 2018 as outlined below.  Drinks 3 beers +2 vodka shots/day for a number of years. DUI x2 years ago, but o/w no hospitalizations for EtOH related issues.  Did endorse withdrawal type symptoms during recent hospitalization.  Has never tried to quit before.  Has lost 10#unintentionally over the last year. No hematochezia, melena. Had reduced appetite over that time. No night sweats or fever. No n/v/d/c.   Evaluation to date: - Platelets 134, WBC 3.2 -07/11/2018: AST/ALT/ALP 73/42/47, T bili 1.8 (although T bili was normal on 6/17 and normal dating back to 2011) -Sodium 132 (now 136, previously normal), creatinine 0.7, albumin 4.3 -INR 1.0 -HIV negative -CT (06/2018): 3 mm pleural nodule, normal liver, otherwise normal - 2018: HCV negative,HBsAg-, HBcAb+; AST 45 and ALT/ALP/T bili all normal   Past medical history, past surgical history, social history, family history, medications, and allergies reviewed  in the chart and with patient.    Past Medical History:  Diagnosis Date  . Syncope 11-2009   ECHO 01-2010 normal     Past Surgical History:  Procedure Laterality Date  . NO PAST SURGERIES     Family History  Problem Relation Age of Onset  . Colon cancer Neg Hx   . Breast cancer Neg Hx   . Prostate cancer Neg Hx   . Heart attack Neg Hx   . Diabetes Neg Hx   . Thyroid disease Neg Hx    Social History   Tobacco Use  . Smoking status: Never Smoker  . Smokeless tobacco: Never Used  Substance Use Topics  . Alcohol use: Yes    Alcohol/week: 21.0 standard drinks    Types: 21 Cans of beer per week    Comment: socially   . Drug use: No   Current Outpatient Medications  Medication Sig Dispense Refill  . amLODipine (NORVASC) 10 MG tablet Take 1 tablet (10 mg total) by mouth at bedtime. 30 tablet 0  . Ascorbic Acid (VITAMIN C PO) Take 1 tablet by mouth daily.    [name]. b complex vitamins tablet Take 1 tablet by mouth daily.    . Docosahexaenoic Acid (DHA PO) Take 1 tablet by mouth daily.    [name]. escitalopram (LEXAPRO) 5 MG tablet Take 1 tablet (5 mg total) by mouth daily. 30 tablet 1  . methimazole (TAPAZOLE) 5 MG tablet Take 1 tablet (5 mg total) by mouth 3 (three) times a week. (Patient taking differently: Take 5 mg by mouth every Monday,  Wednesday, and Friday. ) 15 tablet 0  . Multiple Vitamin (MULTIVITAMIN WITH MINERALS) TABS tablet Take 1 tablet by mouth daily.    . Omega-3 Fatty Acids (FISH OIL PO) Take 1 capsule by mouth daily.    [name] thiamine 100 MG tablet Take 1 tablet (100 mg total) by mouth daily. 30 tablet 0   No current facility-administered medications for this visit.    No Known Allergies   Review of Systems: All systems reviewed and negative except where noted in HPI.     Physical Exam:    Complete physical exam not completed due to the nature of this telehealth communication.   Gen: Awake, alert, and oriented, and well communicative. 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  Prior HBVcAb+ but HBVsAg - in 2018, more suggestive past exposure and no longer circulating (although could have been acute exposure at that time). -Check hep B viral load and HBVsAb repeat HBVsAg - Counseled on EtOH cessation.  Was previously recommended to try AA, which he declined.  4) Elevated T bili: -T bili was normal just prior to admission, then elevated during admission with discharge at 1.8.  Suspect related to his underlying dehydration.  Otherwise normal ALP and normal imaging -Other markers of hepatic synthetic function were normal -Repeat LAE's and T bili in 3 months  5) Weight loss: 6) Reduced appetite - Suspect related to poor nutritional intake, related to EtOH - Check micronutrient panel and prealbumin - EGD with duodenal biopsies at time of colonoscopy as below  7) CRC screening: -Due for age-appropriate CRC screening -Schedule colonoscopy  8) Hyponatremia: -Improving, most currently at 136 and previously normal  The indications, risks, and benefits of EGD and colonoscopy were explained to the patient in detail. Risks include but are not limited to bleeding, perforation, adverse reaction to medications, and cardiopulmonary compromise. Sequelae include but are not limited to the possibility of surgery, hositalization, and mortality. The patient verbalized understanding and wished to proceed. All questions answered, referred to scheduler and bowel  prep ordered. Further recommendations pending results of the exam.    Lavena Bullion, DO, FACG  07/13/2018, 3:38 PM   Colon Branch, MD

## 2018-07-14 DIAGNOSIS — F1010 Alcohol abuse, uncomplicated: Secondary | ICD-10-CM | POA: Insufficient documentation

## 2018-07-14 NOTE — Assessment & Plan Note (Signed)
  Hypertensive urgency: See last visit, blood pressure was quite elevated, started amlodipine and carvedilol.  BP decreased, but subsequently  had a syncope and was admitted to the hospital. He was discharged yesterday on amlodipine, they increase the dose to 10 mg and ask him to stop carvedilol. BP today 105/73, in the low side. Plan: Continue amlodipine 10 mg, discontinue carvedilol but re start if needed.  Amb BPs, see message w/  instructions  Hypokalemia: Was found to have a low potassium in the context of hypertensive urgency.  Potassium was replenish at the hospital, will check a BMP , aldosterone, renin activity to rule out primary hyperaldosteronism.  Will check next week. Syncope: Admitted to the hospital and discharged yesterday, syncope was felt to be due to dehydration and poor oral intake, Brain MRI, echocardiogram normal.  Orthostatic vital signs unremarkable.  No anemia.    So far he is feeling well Alcoholism: The patient admits that he drinks daily, 2 to 3 beers and additional liquor shots.  No alcohol in the last 5 days, denies any shakiness. Advised patient that this is a lifelong illness, recommend AA, he verbalized understanding and is seriously thinking about it.  He is taking thiamine. Anxiety: Anxiety has been an issue for the patient, work-related, recommend a low-dose of Lexapro 5 mg daily. Elevated LFTs: + Hepatitis B core antibody 2018, they have set up a follow-up with GI. Plan: Labs next week RTC at 4 in person be sent for weeks

## 2018-07-15 LAB — CULTURE, BLOOD (ROUTINE X 2)
Culture: NO GROWTH
Culture: NO GROWTH

## 2018-07-24 ENCOUNTER — Other Ambulatory Visit: Payer: Self-pay

## 2018-07-24 ENCOUNTER — Ambulatory Visit (INDEPENDENT_AMBULATORY_CARE_PROVIDER_SITE_OTHER): Payer: Managed Care, Other (non HMO) | Admitting: Internal Medicine

## 2018-07-24 ENCOUNTER — Encounter: Payer: Self-pay | Admitting: Internal Medicine

## 2018-07-24 VITALS — BP 157/97 | HR 77 | Temp 98.6°F | Resp 16 | Ht 68.0 in | Wt 114.0 lb

## 2018-07-24 DIAGNOSIS — F419 Anxiety disorder, unspecified: Secondary | ICD-10-CM

## 2018-07-24 DIAGNOSIS — E876 Hypokalemia: Secondary | ICD-10-CM | POA: Diagnosis not present

## 2018-07-24 DIAGNOSIS — I10 Essential (primary) hypertension: Secondary | ICD-10-CM

## 2018-07-24 DIAGNOSIS — F1010 Alcohol abuse, uncomplicated: Secondary | ICD-10-CM

## 2018-07-24 MED ORDER — AMLODIPINE BESYLATE 10 MG PO TABS: 10.0000 mg | ORAL_TABLET | Freq: Every day | ORAL | 1 refills | Status: DC

## 2018-07-24 MED ORDER — ESCITALOPRAM OXALATE 10 MG PO TABS: 10.0000 mg | ORAL_TABLET | Freq: Every day | ORAL | 1 refills | Status: DC

## 2018-07-24 NOTE — Patient Instructions (Addendum)
  GO TO THE FRONT DESK Schedule blood work to be done next week, you will need to be fasting that the blood work needs to be early in the morning.  Schedule your next appointment   for checkup in 3 months  We will do BOTH the blood work for Dr. Bryan Lemma and for  me    Check the  blood pressure 2 or 3 times a week GOAL is between 110/65 and  135/85. If it is consistently higher or lower, let me know

## 2018-07-24 NOTE — Progress Notes (Signed)
Subjective:    Patient ID: Hunter Mitchell, male    DOB: 01-Nov-1965, 53 y.o.   MRN: [id number]  DOS:  07/24/2018 Type of visit - description: Follow-up Since the last office visit he is doing well HTN: Ambulatory BPs are very good Syncope: No further events EtOH: Not drinking. Anxiety: Started Lexapro, he feels is definitely helping.  Review of Systems Denies fever chills No chest pain or difficulty breathing No nausea, vomiting, diarrhea  Past Medical History:  Diagnosis Date  . Syncope 11-2009   ECHO 01-2010 normal    Past Surgical History:  Procedure Laterality Date  . NO PAST SURGERIES      Social History   Socioeconomic History  . Marital status: Married    Spouse name: Not on file  . Number of children: 3  . Years of education: Not on file  . Highest education level: Not on file  Occupational History  . Occupation: [employer]  . Financial resource strain: Not on file  . Food insecurity    Worry: Not on file    Inability: Not on file  . Transportation needs    Medical: Not on file    Non-medical: Not on file  Tobacco Use  . Smoking status: Never Smoker  . Smokeless tobacco: Never Used  Substance and Sexual Activity  . Alcohol use: Yes    Alcohol/week: 21.0 standard drinks    Types: 21 Cans of beer per week    Comment: socially   . Drug use: No  . Sexual activity: Not on file  Lifestyle  . Physical activity    Days per week: Not on file    Minutes per session: Not on file  . Stress: Not on file  Relationships  . Social Herbalist on phone: Not on file    Gets together: Not on file    Attends religious service: Not on file    Active member of club or organization: Not on file    Attends meetings of clubs or organizations: Not on file    Relationship status: Not on file  . Intimate partner violence    Fear of current or ex partner: Not on file    Emotionally abused: Not on file    Physically abused: Not on file   Forced sexual activity: Not on file  Other Topics Concern  . Not on file  Social History Narrative   Household- pt , wife, a nephew (autistic)   Children x 3,biological, 4 step children         Allergies as of 07/24/2018   No Known Allergies     Medication List       Accurate as of July 24, 2018  2:10 PM. If you have any questions, ask your nurse or doctor.        amLODipine 10 MG tablet Commonly known as: NORVASC Take 1 tablet (10 mg total) by mouth at bedtime.   b complex vitamins tablet Take 1 tablet by mouth daily.   DHA PO Take 1 tablet by mouth daily.   escitalopram 5 MG tablet Commonly known as: Lexapro Take 1 tablet (5 mg total) by mouth daily.   FISH OIL PO Take 1 capsule by mouth daily.   methimazole 5 MG tablet Commonly known as: TAPAZOLE Take 1 tablet (5 mg total) by mouth 3 (three) times a week. What changed: when to take this   multivitamin with minerals Tabs tablet Take 1 tablet by  mouth daily.   thiamine 100 MG tablet Take 1 tablet (100 mg total) by mouth daily.   VITAMIN C PO Take 1 tablet by mouth daily.           Objective:   Physical Exam BP (!) 157/97 (BP Location: Left Arm, Patient Position: Sitting, Cuff Size: Small)   Pulse 77   Temp 98.6 F (37 C) (Oral)   Resp 16   Ht 5\' 8"  (1.727 m)   Wt 114 lb (51.7 kg)   SpO2 98%   BMI 17.33 kg/m  General:   Well developed, NAD, BMI noted. HEENT:  Normocephalic . Face symmetric, atraumatic Lungs:  CTA B Normal respiratory effort, no intercostal retractions, no accessory muscle use. Heart: RRR,  no murmur.  No pretibial edema bilaterally  Skin: Not pale. Not jaundice Neurologic:  alert & oriented X3.  Speech normal, gait appropriate for age and unassisted Psych--  Cognition and judgment appear intact.  Cooperative with normal attention span and concentration.  Behavior appropriate. No anxious or depressed appearing.      Assessment       Assessment Hypertensive  emergency 06/2018 Hyperthyroidism, per endo Syncope, normal echo 2012 Syncope 06/2018, work-up negative Alcohol abuse DX 06/2018  Plan:  Hypertension: Currently on amlodipine 10 mg, refill sent.  BP today slightly elevated, at home range from 105, 125 with a diastolic of 70 or 80.  No change, continue monitoring BPs.  Call if BP increases. Hypokalemia: We will do blood work next week: BMP, aldosterone and renin activity.  Increase LFTs: We will also do all the labs that GI requested. EtOH: He simply stop drinking, advised him that this might be a lifelong issue, do not let his guard down.  Still has to consider AA Anxiety: Taking Lexapro 5 mg, reports that he definitely is improving, no apparent side effects, with up about possibly increase the dose and he elected to do so.  Lexapro 10 mg prescription sent. Follow-up: Labs in few days  RTC 3 to 4 months

## 2018-07-24 NOTE — Progress Notes (Signed)
Pre visit review using our clinic review tool, if applicable. No additional management support is needed unless otherwise documented below in the visit note. 

## 2018-07-26 ENCOUNTER — Other Ambulatory Visit: Payer: Self-pay

## 2018-07-26 DIAGNOSIS — F1010 Alcohol abuse, uncomplicated: Secondary | ICD-10-CM

## 2018-07-26 DIAGNOSIS — R7401 Elevation of levels of liver transaminase levels: Secondary | ICD-10-CM

## 2018-07-26 DIAGNOSIS — Z205 Contact with and (suspected) exposure to viral hepatitis: Secondary | ICD-10-CM

## 2018-07-26 DIAGNOSIS — Z1211 Encounter for screening for malignant neoplasm of colon: Secondary | ICD-10-CM

## 2018-07-26 DIAGNOSIS — R634 Abnormal weight loss: Secondary | ICD-10-CM

## 2018-07-26 DIAGNOSIS — F419 Anxiety disorder, unspecified: Secondary | ICD-10-CM | POA: Insufficient documentation

## 2018-07-26 NOTE — Assessment & Plan Note (Signed)
Hypertension: Currently on amlodipine 10 mg, refill sent.  BP today slightly elevated, at home range from 105, 921 with a diastolic of 70 or 80.  No change, continue monitoring BPs.  Call if BP increases. Hypokalemia: We will do blood work next week: BMP, aldosterone and renin activity.  Increase LFTs: We will also do all the labs that GI requested. EtOH: He simply stop drinking, advised him that this might be a lifelong issue, do not let his guard down.  Still has to consider AA Anxiety: Taking Lexapro 5 mg, reports that he definitely is improving, no apparent side effects, with up about possibly increase the dose and he elected to do so.  Lexapro 10 mg prescription sent. Follow-up: Labs in few days  RTC 3 to 4 months

## 2018-08-02 ENCOUNTER — Other Ambulatory Visit: Payer: Self-pay

## 2018-08-02 ENCOUNTER — Other Ambulatory Visit (INDEPENDENT_AMBULATORY_CARE_PROVIDER_SITE_OTHER): Payer: Managed Care, Other (non HMO)

## 2018-08-02 DIAGNOSIS — Z205 Contact with and (suspected) exposure to viral hepatitis: Secondary | ICD-10-CM | POA: Diagnosis not present

## 2018-08-02 DIAGNOSIS — R634 Abnormal weight loss: Secondary | ICD-10-CM

## 2018-08-02 DIAGNOSIS — R740 Nonspecific elevation of levels of transaminase and lactic acid dehydrogenase [LDH]: Secondary | ICD-10-CM | POA: Diagnosis not present

## 2018-08-02 DIAGNOSIS — I10 Essential (primary) hypertension: Secondary | ICD-10-CM

## 2018-08-02 DIAGNOSIS — Z1211 Encounter for screening for malignant neoplasm of colon: Secondary | ICD-10-CM

## 2018-08-02 DIAGNOSIS — R7401 Elevation of levels of liver transaminase levels: Secondary | ICD-10-CM

## 2018-08-02 DIAGNOSIS — F1010 Alcohol abuse, uncomplicated: Secondary | ICD-10-CM

## 2018-08-02 DIAGNOSIS — E876 Hypokalemia: Secondary | ICD-10-CM

## 2018-08-02 LAB — IBC + FERRITIN
Ferritin: 53 ng/mL (ref 22.0–322.0)
Iron: 94 ug/dL (ref 42–165)
Saturation Ratios: 22.2 % (ref 20.0–50.0)
Transferrin: 303 mg/dL (ref 212.0–360.0)

## 2018-08-02 LAB — BASIC METABOLIC PANEL WITH GFR
BUN: 12 mg/dL (ref 6–23)
CO2: 25 meq/L (ref 19–32)
Calcium: 9.5 mg/dL (ref 8.4–10.5)
Chloride: 100 meq/L (ref 96–112)
Creatinine, Ser: 0.7 mg/dL (ref 0.40–1.50)
GFR: 143.57 mL/min
Glucose, Bld: 91 mg/dL (ref 70–99)
Potassium: 3.7 meq/L (ref 3.5–5.1)
Sodium: 138 meq/L (ref 135–145)

## 2018-08-02 LAB — VITAMIN D 25 HYDROXY (VIT D DEFICIENCY, FRACTURES): VITD: 26.16 ng/mL — ABNORMAL LOW (ref 30.00–100.00)

## 2018-08-02 LAB — FOLATE: Folate: 9.8 ng/mL (ref >5.9)

## 2018-08-02 LAB — VITAMIN B12: Vitamin B-12: 750 pg/mL (ref 211–911)

## 2018-08-02 NOTE — Addendum Note (Signed)
Addended by: Kelle Darting A on: 08/02/2018 08:59 AM   Modules accepted: Orders

## 2018-08-09 LAB — ALDOSTERONE + RENIN ACTIVITY W/ RATIO
ALDO / PRA Ratio: 0.9 Ratio (ref 0.9–28.9)
Aldosterone: 2 ng/dL
Renin Activity: 2.34 ng/mL/h (ref 0.25–5.82)

## 2018-08-09 LAB — HEPATITIS B DNA, ULTRAQUANTITATIVE, PCR
Hepatitis B DNA (Calc): <1 Log IU/mL
Hepatitis B DNA: <10 IU/mL

## 2018-08-09 LAB — PREALBUMIN: Prealbumin: 28 mg/dL (ref 21–43)

## 2018-08-09 LAB — HEPATITIS B SURFACE ANTIBODY,QUALITATIVE: Hep B S Ab: REACTIVE — AB

## 2018-08-09 LAB — HEPATITIS B SURFACE ANTIGEN: Hepatitis B Surface Ag: NONREACTIVE

## 2018-08-15 ENCOUNTER — Encounter: Payer: Self-pay | Admitting: Internal Medicine

## 2018-08-16 ENCOUNTER — Telehealth: Payer: Self-pay | Admitting: Internal Medicine

## 2018-08-16 MED ORDER — THIAMINE HCL 100 MG PO TABS: 100.0000 mg | ORAL_TABLET | Freq: Every day | ORAL | 6 refills | Status: DC

## 2018-08-16 NOTE — Telephone Encounter (Signed)
Thyroid medication, Tapazole, needs to be refilled by endocrine. Okay to refill thiamine #30, 6 RF

## 2018-08-16 NOTE — Telephone Encounter (Signed)
Pt requesting a refill for : methimazole (TAPAZOLE) 5 MG tablet   thiamine 100 MG tablet    Pharmacy:  [facility], [address] [place] [phone] (Phone) [phone] (Fax)     Spouse would also like to know if pt is to continue taking thyroid medication? If so, pt need a refill as well.     CB: [phone] Doroteo Bradford

## 2018-08-16 NOTE — Telephone Encounter (Signed)
Please advise 

## 2018-08-16 NOTE — Telephone Encounter (Signed)
Spoke w/ Pt's wife Doroteo Bradford, informed that thiamine has been refilled but that Pt is overdue to see Dr. Loanne Drilling at endo- she will contact their office regarding Tapazole.

## 2018-08-17 ENCOUNTER — Other Ambulatory Visit: Payer: Self-pay

## 2018-08-17 DIAGNOSIS — F1010 Alcohol abuse, uncomplicated: Secondary | ICD-10-CM

## 2018-08-17 MED ORDER — VITAMIN D (ERGOCALCIFEROL) 1.25 MG (50000 UNIT) PO CAPS: 50000.0000 [IU] | ORAL_CAPSULE | ORAL | 0 refills | Status: DC

## 2018-08-29 NOTE — Progress Notes (Signed)
Letter sent to patient with MD recommendations/message

## 2018-09-14 ENCOUNTER — Telehealth: Payer: Self-pay | Admitting: Gastroenterology

## 2018-09-14 NOTE — Telephone Encounter (Signed)
Pt's wife Doroteo Bradford called requesting rf for Vit D, she is not sure if pt needs to continue taking it or not.

## 2018-09-19 NOTE — Telephone Encounter (Signed)
LMOM for patient to call me back regarding Vitamin D, pt is supped to take Vit D 50,000u once weekly then decrease to 2,000u daily(OTC)

## 2018-10-24 ENCOUNTER — Other Ambulatory Visit: Payer: Self-pay

## 2018-10-24 ENCOUNTER — Ambulatory Visit (INDEPENDENT_AMBULATORY_CARE_PROVIDER_SITE_OTHER): Payer: Managed Care, Other (non HMO) | Admitting: Internal Medicine

## 2018-10-24 ENCOUNTER — Encounter: Payer: Self-pay | Admitting: Internal Medicine

## 2018-10-24 VITALS — BP 136/77 | HR 81 | Temp 97.7°F | Resp 16 | Ht 68.0 in | Wt 118.1 lb

## 2018-10-24 DIAGNOSIS — I10 Essential (primary) hypertension: Secondary | ICD-10-CM

## 2018-10-24 DIAGNOSIS — E559 Vitamin D deficiency, unspecified: Secondary | ICD-10-CM | POA: Diagnosis not present

## 2018-10-24 DIAGNOSIS — Z125 Encounter for screening for malignant neoplasm of prostate: Secondary | ICD-10-CM | POA: Diagnosis not present

## 2018-10-24 DIAGNOSIS — E876 Hypokalemia: Secondary | ICD-10-CM | POA: Diagnosis not present

## 2018-10-24 LAB — LIPID PANEL
Cholesterol: 251 mg/dL — ABNORMAL HIGH (ref 0–200)
HDL: 179.3 mg/dL (ref >39.00)
LDL Cholesterol: 65 mg/dL (ref 0–99)
NonHDL: 72.19
Total CHOL/HDL Ratio: 1
Triglycerides: 37 mg/dL (ref 0.0–149.0)
VLDL: 7.4 mg/dL (ref 0.0–40.0)

## 2018-10-24 LAB — PSA: PSA: 2.5 ng/mL (ref 0.10–4.00)

## 2018-10-24 NOTE — Progress Notes (Signed)
Pre visit review using our clinic review tool, if applicable. No additional management support is needed unless otherwise documented below in the visit note. 

## 2018-10-24 NOTE — Patient Instructions (Addendum)
   GO TO THE LAB : Get the blood work     GO TO THE FRONT DESK Schedule your next appointment   for a physical exam in 6 months    Check the  blood pressure weekly BP GOAL is between 110/65 and  135/85. If it is consistently higher or lower, let me know    Call [facility]: [phone]

## 2018-10-24 NOTE — Progress Notes (Signed)
Subjective:    Patient ID: Hunter Mitchell, male    DOB: 10/25/1965, 53 y.o.   MRN: [id number]  DOS:  10/24/2018 Type of visit - description: Follow-up HTN: Good med compliance, ambulatory BPs are very good. On Lexapro, working really well for him. Good compliance with vitamin D supplements     Review of Systems  Denies nausea, vomiting, diarrhea. Denies dysuria, gross hematuria difficulty urinating.  Past Medical History:  Diagnosis Date  . Syncope 11-2009   ECHO 01-2010 normal    Past Surgical History:  Procedure Laterality Date  . NO PAST SURGERIES      Social History   Socioeconomic History  . Marital status: Married    Spouse name: Not on file  . Number of children: 3  . Years of education: Not on file  . Highest education level: Not on file  Occupational History  . Occupation: [employer]  . Financial resource strain: Not on file  . Food insecurity    Worry: Not on file    Inability: Not on file  . Transportation needs    Medical: Not on file    Non-medical: Not on file  Tobacco Use  . Smoking status: Never Smoker  . Smokeless tobacco: Never Used  Substance and Sexual Activity  . Alcohol use: Yes    Alcohol/week: 21.0 standard drinks    Types: 21 Cans of beer per week    Comment: socially   . Drug use: No  . Sexual activity: Not on file  Lifestyle  . Physical activity    Days per week: Not on file    Minutes per session: Not on file  . Stress: Not on file  Relationships  . Social Herbalist on phone: Not on file    Gets together: Not on file    Attends religious service: Not on file    Active member of club or organization: Not on file    Attends meetings of clubs or organizations: Not on file    Relationship status: Not on file  . Intimate partner violence    Fear of current or ex partner: Not on file    Emotionally abused: Not on file    Physically abused: Not on file    Forced sexual activity: Not on file   Other Topics Concern  . Not on file  Social History Narrative   Household- pt , wife, a nephew (autistic)   Children x 3,biological, 4 step children         Allergies as of 10/24/2018   No Known Allergies     Medication List       Accurate as of October 24, 2018 11:59 PM. If you have any questions, ask your nurse or doctor.        amLODipine 10 MG tablet Commonly known as: NORVASC Take 1 tablet (10 mg total) by mouth at bedtime.   b complex vitamins tablet Take 1 tablet by mouth daily.   DHA PO Take 1 tablet by mouth daily.   escitalopram 10 MG tablet Commonly known as: LEXAPRO Take 1 tablet (10 mg total) by mouth daily.   FISH OIL PO Take 1 capsule by mouth daily.   methimazole 5 MG tablet Commonly known as: TAPAZOLE Take 1 tablet (5 mg total) by mouth 3 (three) times a week.   multivitamin with minerals Tabs tablet Take 1 tablet by mouth daily.   thiamine 100 MG tablet Take 1 tablet (100  mg total) by mouth daily.   VITAMIN C PO Take 1 tablet by mouth daily.   Vitamin D (Ergocalciferol) 1.25 MG (50000 UT) Caps capsule Commonly known as: DRISDOL Take 1 capsule (50,000 Units total) by mouth every 7 (seven) days.           Objective:   Physical Exam BP 136/77 (BP Location: Right Arm, Patient Position: Sitting, Cuff Size: Small)   Pulse 81   Temp 97.7 F (36.5 C) (Temporal)   Resp 16   Ht 5\' 8"  (1.727 m)   Wt 118 lb 2 oz (53.6 kg)   SpO2 97%   BMI 17.96 kg/m  General:   Well developed, NAD, BMI noted. HEENT:  Normocephalic . Face symmetric, atraumatic Lungs:  CTA B Normal respiratory effort, no intercostal retractions, no accessory muscle use. Heart: RRR,  no murmur.  No pretibial edema bilaterally DRE: No stools, normal sphincter tone, prostate symmetric, not enlarged, nonnodular. Skin: Not pale. Not jaundice Neurologic:  alert & oriented X3.  Speech normal, gait appropriate for age and unassisted Psych--  Cognition and judgment  appear intact.  Cooperative with normal attention span and concentration.  Behavior appropriate. No anxious or depressed appearing.       Assessment      Assessment Hypertensive emergency 06/2018 Hyperthyroidism, per endo Syncope, normal echo 2012 Syncope 06/2018, work-up negative Alcohol abuse DX 06/2018 H/o Hep B Vitamin D deficiency DX 07-2018  PLAN: HTN: Controlled on amlodipine, last BMP satisfactory, monitor BPs, check FLP Hyperthyroidism: Not taking methimazole, for the last few weeks, strong encouraged to contact Endo for evaluation and refill. Hypokalemia: Resolved,Aldosterone-renin activity ratio was 0.86, very close to normal.  Recheck potassium level on RTC EtOH: Continue reporting abstinence History of hepatitis B: Labs were done, GI recommended EGD and colonoscopy, patient is very hesitant to proceed.  Encouraged to think about it. Vitamin D deficiency: On supplements Preventive care: Declined a flu shot, benefits discussed Prostate cancer screening: DRE normal, check a PSA, again was recommended a colonoscopy. RTC 6 months CPX  Today, I spent more than  25  min with the patient: >50% of the time counseling regards need to see endocrinology, benefits of proceeding with GI evaluation, review she is chart.

## 2018-10-25 NOTE — Assessment & Plan Note (Signed)
PLAN: HTN: Controlled on amlodipine, last BMP satisfactory, monitor BPs, check FLP Hyperthyroidism: Not taking methimazole, for the last few weeks, strong encouraged to contact Endo for evaluation and refill. Hypokalemia: Resolved,Aldosterone-renin activity ratio was 0.86, very close to normal.  Recheck potassium level on RTC EtOH: Continue reporting abstinence History of hepatitis B: Labs were done, GI recommended EGD and colonoscopy, patient is very hesitant to proceed.  Encouraged to think about it. Vitamin D deficiency: On supplements Preventive care: Declined a flu shot, benefits discussed Prostate cancer screening: DRE normal, check a PSA, again was recommended a colonoscopy. RTC 6 months CPX

## 2018-11-20 ENCOUNTER — Other Ambulatory Visit: Payer: Self-pay | Admitting: Gastroenterology

## 2018-11-20 DIAGNOSIS — F1010 Alcohol abuse, uncomplicated: Secondary | ICD-10-CM

## 2018-11-22 ENCOUNTER — Other Ambulatory Visit: Payer: Self-pay | Admitting: Gastroenterology

## 2018-11-22 DIAGNOSIS — F1010 Alcohol abuse, uncomplicated: Secondary | ICD-10-CM

## 2018-11-27 ENCOUNTER — Telehealth: Payer: Self-pay

## 2018-11-27 MED ORDER — AMLODIPINE BESYLATE 10 MG PO TABS: 10.0000 mg | ORAL_TABLET | Freq: Every day | ORAL | 0 refills | Status: DC

## 2018-11-27 NOTE — Telephone Encounter (Signed)
15 day supply sent to [facility].

## 2018-11-27 NOTE — Telephone Encounter (Signed)
Copied from [place] [id number]. Topic: General - Other >> Nov 27, 2018  2:58 PM Carolyn Stare wrote: Pt wife said they are waiting for there mail order of the below medication and is asking if maybe 10 pills can be called into the local pharmacy    amLODipine (NORVASC) 10 MG tablet

## 2018-12-10 ENCOUNTER — Other Ambulatory Visit: Payer: Self-pay

## 2018-12-10 ENCOUNTER — Telehealth: Payer: Self-pay | Admitting: Internal Medicine

## 2018-12-10 ENCOUNTER — Encounter: Payer: Self-pay | Admitting: Endocrinology

## 2018-12-10 ENCOUNTER — Ambulatory Visit: Payer: Managed Care, Other (non HMO) | Admitting: Endocrinology

## 2018-12-10 ENCOUNTER — Other Ambulatory Visit: Payer: Self-pay | Admitting: Internal Medicine

## 2018-12-10 VITALS — BP 130/80 | HR 85 | Ht 68.0 in | Wt 124.0 lb

## 2018-12-10 DIAGNOSIS — E0590 Thyrotoxicosis, unspecified without thyrotoxic crisis or storm: Secondary | ICD-10-CM | POA: Diagnosis not present

## 2018-12-10 LAB — T4, FREE: Free T4: 0.62 ng/dL (ref 0.60–1.60)

## 2018-12-10 LAB — TSH: TSH: 0.3 u[IU]/mL — ABNORMAL LOW (ref 0.35–4.50)

## 2018-12-10 MED ORDER — ESCITALOPRAM OXALATE 10 MG PO TABS: 10.0000 mg | ORAL_TABLET | Freq: Every day | ORAL | 1 refills | Status: DC

## 2018-12-10 MED ORDER — METHIMAZOLE 5 MG PO TABS: 5.0000 mg | ORAL_TABLET | ORAL | 1 refills | Status: DC

## 2018-12-10 NOTE — Progress Notes (Signed)
Subjective:    Patient ID: Hunter Mitchell, male    DOB: 03-28-1965, 53 y.o.   MRN: [id number]  HPI Pt returns for f/u of mild hyperthyroidism (dx'ed 2012; he has never been on therapy for this; he has never had thyroid imaging; tapazole was chosen as rx, due to mild degree of TSH abnormality).  He took tapazole as rx'ed, until he ran out 2 months ago.  pt states he feels well in general, except for anxiety.  Past Medical History:  Diagnosis Date  . Syncope 11-2009   ECHO 01-2010 normal    Past Surgical History:  Procedure Laterality Date  . NO PAST SURGERIES      Social History   Socioeconomic History  . Marital status: Married    Spouse name: Not on file  . Number of children: 3  . Years of education: Not on file  . Highest education level: Not on file  Occupational History  . Occupation: [employer]  . Financial resource strain: Not on file  . Food insecurity    Worry: Not on file    Inability: Not on file  . Transportation needs    Medical: Not on file    Non-medical: Not on file  Tobacco Use  . Smoking status: Never Smoker  . Smokeless tobacco: Never Used  Substance and Sexual Activity  . Alcohol use: Yes    Alcohol/week: 21.0 standard drinks    Types: 21 Cans of beer per week    Comment: socially   . Drug use: No  . Sexual activity: Not on file  Lifestyle  . Physical activity    Days per week: Not on file    Minutes per session: Not on file  . Stress: Not on file  Relationships  . Social Musician on phone: Not on file    Gets together: Not on file    Attends religious service: Not on file    Active member of club or organization: Not on file    Attends meetings of clubs or organizations: Not on file    Relationship status: Not on file  . Intimate partner violence    Fear of current or ex partner: Not on file    Emotionally abused: Not on file    Physically abused: Not on file    Forced sexual activity: Not on file  Other  Topics Concern  . Not on file  Social History Narrative   Household- pt , wife, a nephew (autistic)   Children x 3,biological, 4 step children       Current Outpatient Medications on File Prior to Visit  Medication Sig Dispense Refill  . amLODipine (NORVASC) 10 MG tablet Take 1 tablet (10 mg total) by mouth at bedtime. 15 tablet 0  . Ascorbic Acid (VITAMIN C PO) Take 1 tablet by mouth daily.    [name] b complex vitamins tablet Take 1 tablet by mouth daily.    . Docosahexaenoic Acid (DHA PO) Take 1 tablet by mouth daily.    . Multiple Vitamin (MULTIVITAMIN WITH MINERALS) TABS tablet Take 1 tablet by mouth daily.    . Omega-3 Fatty Acids (FISH OIL PO) Take 1 capsule by mouth daily.    [name] thiamine 100 MG tablet Take 1 tablet (100 mg total) by mouth daily. 30 tablet 6  . Vitamin D, Ergocalciferol, (DRISDOL) 1.25 MG (50000 UT) CAPS capsule Take 1 capsule (50,000 Units total) by mouth every 7 (seven) days. 8 capsule  0   No current facility-administered medications on file prior to visit.     No Known Allergies  Family History  Problem Relation Age of Onset  . Colon cancer Neg Hx   . Breast cancer Neg Hx   . Prostate cancer Neg Hx   . Heart attack Neg Hx   . Diabetes Neg Hx   . Thyroid disease Neg Hx     BP 130/80   Pulse 85   Ht 5\' 8"  (1.727 m)   Wt 124 lb (56.2 kg)   SpO2 98%   BMI 18.85 kg/m    Review of Systems Denies sob    Objective:   Physical Exam VITAL SIGNS:  See vs page GENERAL: no distress NECK: I can palpate the top of the thyroid, which is approx 3 times normal size.  No thyroid nodule is palpable.  No palpable lymphadenopathy at the anterior neck. SKIN: not diaphoretic NEURO: no tremor.   Lab Results  Component Value Date   TSH 0.30 (L) 12/10/2018       Assessment & Plan:  Hyperthyroidism, worse: resume methimazole, 3 times per week   Patient Instructions  Thyroid blood tests are requested for you today.  We'll let you know about the results.  If  ever you have fever while taking methimazole, stop it and call us, even if the reason is obvious, because of the risk of a rare side-effect.  If the blood tests are normal, please come back for a follow-up appointment in 3 months.

## 2018-12-10 NOTE — Telephone Encounter (Signed)
Caller name: Relation to SJ:GGEZ Call back [place]:  Reason for call: pt Escitalopram 10 mg has ran out, medication comes from [facility] home delivery, pt ran out 3 days ago. Pt requesting rx to be called in for home delivery as well as it Dr. Larose Kells can send some in for him until he gets the medication. Please send to [facility] on [address] states it should be on file

## 2018-12-10 NOTE — Patient Instructions (Addendum)
Thyroid blood tests are requested for you today.  We'll let you know about the results.  If ever you have fever while taking methimazole, stop it and call us, even if the reason is obvious, because of the risk of a rare side-effect.  If the blood tests are normal, please come back for a follow-up appointment in 3 months.

## 2018-12-11 NOTE — Telephone Encounter (Signed)
Noted  

## 2018-12-11 NOTE — Telephone Encounter (Signed)
Patient wife called to say that Rx arrived in the mail on 12/10/2018 escitalopram (LEXAPRO) 10 MG tablet

## 2019-03-11 ENCOUNTER — Other Ambulatory Visit: Payer: Self-pay

## 2019-03-11 ENCOUNTER — Encounter: Payer: Self-pay | Admitting: Endocrinology

## 2019-03-11 ENCOUNTER — Ambulatory Visit: Payer: PRIVATE HEALTH INSURANCE | Admitting: Endocrinology

## 2019-03-11 VITALS — BP 142/88 | HR 86 | Ht 68.0 in | Wt 120.6 lb

## 2019-03-11 DIAGNOSIS — E0590 Thyrotoxicosis, unspecified without thyrotoxic crisis or storm: Secondary | ICD-10-CM

## 2019-03-11 LAB — T4, FREE: Free T4: 0.38 ng/dL — ABNORMAL LOW (ref 0.60–1.60)

## 2019-03-11 LAB — TSH: TSH: 0.32 u[IU]/mL — ABNORMAL LOW (ref 0.35–4.50)

## 2019-03-11 MED ORDER — METHIMAZOLE 5 MG PO TABS: 5.0000 mg | ORAL_TABLET | Freq: Every day | ORAL | 1 refills | Status: DC

## 2019-03-11 MED ORDER — METHIMAZOLE 5 MG PO TABS: 5.0000 mg | ORAL_TABLET | ORAL | 1 refills | Status: DC

## 2019-03-11 NOTE — Patient Instructions (Addendum)
Blood tests are requested for you today.  We'll let you know about the results.   °If ever you have fever while taking methimazole, stop it and call us, even if the reason is obvious, because of the risk of a rare side-effect.   °It is best to never miss the medication.  However, if you do miss it, next best is to double up the next time.   °Please come back for a follow-up appointment in 3 months.   °

## 2019-03-11 NOTE — Progress Notes (Signed)
Subjective:    Patient ID: Hunter Mitchell, male    DOB: [birth year], 54 y.o.   MRN: [id number]  HPI Pt returns for f/u of mild hyperthyroidism (dx'ed 2012; he has never been on therapy for this; he has never had thyroid imaging; tapazole was chosen as rx, due to mild degree of TSH abnormality).  He takes tapazole as rx'ed.  pt states he feels well in general.  Past Medical History:  Diagnosis Date  . Syncope 11-2009   ECHO 01-2010 normal    Past Surgical History:  Procedure Laterality Date  . NO PAST SURGERIES      Social History   Socioeconomic History  . Marital status: Married    Spouse name: Not on file  . Number of children: 3  . Years of education: Not on file  . Highest education level: Not on file  Occupational History  . Occupation: [employer]  Tobacco Use  . Smoking status: Never Smoker  . Smokeless tobacco: Never Used  Substance and Sexual Activity  . Alcohol use: Yes    Alcohol/week: 21.0 standard drinks    Types: 21 Cans of beer per week    Comment: socially   . Drug use: No  . Sexual activity: Not on file  Other Topics Concern  . Not on file  Social History Narrative   Household- pt , wife, a nephew (autistic)   Children x 3,biological, 4 step children      Social Determinants of Health   Financial Resource Strain:   . Difficulty of Paying Living Expenses: Not on file  Food Insecurity:   . Worried About Programme researcher, broadcasting/film/video in the Last Year: Not on file  . Ran Out of Food in the Last Year: Not on file  Transportation Needs:   . Lack of Transportation (Medical): Not on file  . Lack of Transportation (Non-Medical): Not on file  Physical Activity:   . Days of Exercise per Week: Not on file  . Minutes of Exercise per Session: Not on file  Stress:   . Feeling of Stress : Not on file  Social Connections:   . Frequency of Communication with Friends and Family: Not on file  . Frequency of Social Gatherings with Friends and Family: Not on file  .  Attends Religious Services: Not on file  . Active Member of Clubs or Organizations: Not on file  . Attends Banker Meetings: Not on file  . Marital Status: Not on file  Intimate Partner Violence:   . Fear of Current or Ex-Partner: Not on file  . Emotionally Abused: Not on file  . Physically Abused: Not on file  . Sexually Abused: Not on file    Current Outpatient Medications on File Prior to Visit  Medication Sig Dispense Refill  . amLODipine (NORVASC) 10 MG tablet Take 1 tablet (10 mg total) by mouth at bedtime. 15 tablet 0  . Ascorbic Acid (VITAMIN C PO) Take 1 tablet by mouth daily.    [name] b complex vitamins tablet Take 1 tablet by mouth daily.    . Docosahexaenoic Acid (DHA PO) Take 1 tablet by mouth daily.    [name] escitalopram (LEXAPRO) 10 MG tablet Take 1 tablet (10 mg total) by mouth daily. 90 tablet 1  . Multiple Vitamin (MULTIVITAMIN WITH MINERALS) TABS tablet Take 1 tablet by mouth daily.    . Omega-3 Fatty Acids (FISH OIL PO) Take 1 capsule by mouth daily.    [name] thiamine 100 MG  tablet Take 1 tablet (100 mg total) by mouth daily. 30 tablet 6  . Vitamin D, Ergocalciferol, (DRISDOL) 1.25 MG (50000 UT) CAPS capsule Take 1 capsule (50,000 Units total) by mouth every 7 (seven) days. 8 capsule 0   No current facility-administered medications on file prior to visit.    No Known Allergies  Family History  Problem Relation Age of Onset  . Colon cancer Neg Hx   . Breast cancer Neg Hx   . Prostate cancer Neg Hx   . Heart attack Neg Hx   . Diabetes Neg Hx   . Thyroid disease Neg Hx     BP (!) 142/88 (BP Location: Left Arm, Patient Position: Sitting, Cuff Size: Normal)   Pulse 86   Ht 5\' 8"  (1.727 m)   Wt 120 lb 9.6 oz (54.7 kg)   SpO2 94%   BMI 18.34 kg/m    Review of Systems Denies fever    Objective:   Physical Exam VITAL SIGNS:  See vs page GENERAL: no distress NECK: thyroid is slightly and diffusely enlarged.  Lab Results  Component Value Date   TSH  0.32 (L) 03/11/2019      Assessment & Plan:  Hyperthyroidism, improved.   HTN: recheck next time  Patient Instructions  Blood tests are requested for you today.  We'll let you know about the results.  If ever you have fever while taking methimazole, stop it and call us, even if the reason is obvious, because of the risk of a rare side-effect.   It is best to never miss the medication.  However, if you do miss it, next best is to double up the next time.   Please come back for a follow-up appointment in 3 months.

## 2019-03-20 ENCOUNTER — Telehealth: Payer: Self-pay | Admitting: Internal Medicine

## 2019-03-20 MED ORDER — ESCITALOPRAM OXALATE 10 MG PO TABS: 10.0000 mg | ORAL_TABLET | Freq: Every day | ORAL | 1 refills | Status: DC

## 2019-03-20 MED ORDER — AMLODIPINE BESYLATE 10 MG PO TABS: 10.0000 mg | ORAL_TABLET | Freq: Every day | ORAL | 1 refills | Status: DC

## 2019-03-20 NOTE — Telephone Encounter (Signed)
escitalopram (LEXAPRO) 10 MG tablet   amLODipine (NORVASC) 10 MG  Pt states that they would like for meds to come to [facility] [address] [place], [place] - [address]  [address] [place], [place] [address]  Phone:  [phone] Fax:  [phone]   Instead of [facility] [employer] Mail in order... Please Advise patient if change is possible. Thanks

## 2019-03-20 NOTE — Telephone Encounter (Signed)
Rxs sent

## 2019-04-24 ENCOUNTER — Ambulatory Visit (INDEPENDENT_AMBULATORY_CARE_PROVIDER_SITE_OTHER): Payer: Managed Care, Other (non HMO) | Admitting: Internal Medicine

## 2019-04-24 ENCOUNTER — Encounter: Payer: Self-pay | Admitting: Internal Medicine

## 2019-04-24 ENCOUNTER — Other Ambulatory Visit: Payer: Self-pay

## 2019-04-24 VITALS — BP 120/79 | HR 71 | Temp 98.7°F | Resp 16 | Ht 68.0 in | Wt 119.0 lb

## 2019-04-24 DIAGNOSIS — D72819 Decreased white blood cell count, unspecified: Secondary | ICD-10-CM

## 2019-04-24 DIAGNOSIS — I10 Essential (primary) hypertension: Secondary | ICD-10-CM | POA: Diagnosis not present

## 2019-04-24 DIAGNOSIS — E559 Vitamin D deficiency, unspecified: Secondary | ICD-10-CM

## 2019-04-24 DIAGNOSIS — Z0000 Encounter for general adult medical examination without abnormal findings: Secondary | ICD-10-CM | POA: Diagnosis not present

## 2019-04-24 LAB — COMPREHENSIVE METABOLIC PANEL
ALT: 44 U/L (ref 0–53)
AST: 83 U/L — ABNORMAL HIGH (ref 0–37)
Albumin: 4.5 g/dL (ref 3.5–5.2)
Alkaline Phosphatase: 53 U/L (ref 39–117)
BUN: 9 mg/dL (ref 6–23)
CO2: 29 mEq/L (ref 19–32)
Calcium: 9.7 mg/dL (ref 8.4–10.5)
Chloride: 99 mEq/L (ref 96–112)
Creatinine, Ser: 0.63 mg/dL (ref 0.40–1.50)
GFR: 161.67 mL/min (ref >60.00)
Glucose, Bld: 86 mg/dL (ref 70–99)
Potassium: 3.5 mEq/L (ref 3.5–5.1)
Sodium: 138 mEq/L (ref 135–145)
Total Bilirubin: 0.5 mg/dL (ref 0.2–1.2)
Total Protein: 7.7 g/dL (ref 6.0–8.3)

## 2019-04-24 LAB — CBC WITH DIFFERENTIAL/PLATELET
Basophils Absolute: 0 10*3/uL (ref 0.0–0.1)
Basophils Relative: 1.2 % (ref 0.0–3.0)
Eosinophils Absolute: 0 10*3/uL (ref 0.0–0.7)
Eosinophils Relative: 1.1 % (ref 0.0–5.0)
HCT: 39.5 % (ref 39.0–52.0)
Hemoglobin: 13.2 g/dL (ref 13.0–17.0)
Lymphocytes Relative: 31.8 % (ref 12.0–46.0)
Lymphs Abs: 0.8 10*3/uL (ref 0.7–4.0)
MCHC: 33.3 g/dL (ref 30.0–36.0)
MCV: 90.5 fl (ref 78.0–100.0)
Monocytes Absolute: 0.4 10*3/uL (ref 0.1–1.0)
Monocytes Relative: 14.1 % — ABNORMAL HIGH (ref 3.0–12.0)
Neutro Abs: 1.3 10*3/uL — ABNORMAL LOW (ref 1.4–7.7)
Neutrophils Relative %: 51.8 % (ref 43.0–77.0)
Platelets: 259 10*3/uL (ref 150.0–400.0)
RBC: 4.36 Mil/uL (ref 4.22–5.81)
RDW: 14.6 % (ref 11.5–15.5)
WBC: 2.6 10*3/uL — ABNORMAL LOW (ref 4.0–10.5)

## 2019-04-24 LAB — VITAMIN D 25 HYDROXY (VIT D DEFICIENCY, FRACTURES): VITD: 17.75 ng/mL — ABNORMAL LOW (ref 30.00–100.00)

## 2019-04-24 NOTE — Progress Notes (Signed)
Subjective:    Patient ID: Hunter Mitchell, male    DOB: 1965/09/17, 54 y.o.   MRN: [id number]  DOS:  04/24/2019 Type of visit - description: CPX Since the last office visit he feels well, no concerns  Wt Readings from Last 3 Encounters:  04/24/19 119 lb (54 kg)  03/11/19 120 lb 9.6 oz (54.7 kg)  12/10/18 124 lb (56.2 kg)     Review of Systems  A 14 point review of systems is negative    Past Medical History:  Diagnosis Date  . Syncope 11-2009   ECHO 01-2010 normal    Past Surgical History:  Procedure Laterality Date  . NO PAST SURGERIES     Family History  Problem Relation Age of Onset  . Colon cancer Neg Hx   . Breast cancer Neg Hx   . Prostate cancer Neg Hx   . Heart attack Neg Hx   . Diabetes Neg Hx   . Thyroid disease Neg Hx     Allergies as of 04/24/2019   No Known Allergies     Medication List       Accurate as of April 24, 2019 11:59 PM. If you have any questions, ask your nurse or doctor.        STOP taking these medications   thiamine 100 MG tablet Stopped by: Kathlene November, MD   Vitamin D (Ergocalciferol) 1.25 MG (50000 UNIT) Caps capsule Commonly known as: DRISDOL Stopped by: Kathlene November, MD     TAKE these medications   amLODipine 10 MG tablet Commonly known as: NORVASC Take 1 tablet (10 mg total) by mouth at bedtime.   b complex vitamins tablet Take 1 tablet by mouth daily.   DHA PO Take 1 tablet by mouth daily.   escitalopram 10 MG tablet Commonly known as: LEXAPRO Take 1 tablet (10 mg total) by mouth daily.   FISH OIL PO Take 1 capsule by mouth daily.   methimazole 5 MG tablet Commonly known as: TAPAZOLE Take 1 tablet (5 mg total) by mouth 3 (three) times a week.   multivitamin with minerals Tabs tablet Take 1 tablet by mouth daily.   VITAMIN C PO Take 1 tablet by mouth daily.          Objective:   Physical Exam BP 120/79 (BP Location: Left Arm, Patient Position: Sitting, Cuff Size: Small)   Pulse 71   Temp 98.7 F  (37.1 C) (Temporal)   Resp 16   Ht 5\' 8"  (1.727 m)   Wt 119 lb (54 kg)   SpO2 98%   BMI 18.09 kg/m  General: Well developed, NAD, BMI noted Neck: No  thyromegaly  HEENT:  Normocephalic . Face symmetric, atraumatic Lungs:  CTA B Normal respiratory effort, no intercostal retractions, no accessory muscle use. Heart: RRR,  no murmur.  Abdomen:  Not distended, soft, non-tender. No rebound or rigidity.   Lower extremities: no pretibial edema bilaterally  Skin: Exposed areas without rash. Not pale. Not jaundice Neurologic:  alert & oriented X3.  Speech normal, gait appropriate for age and unassisted Strength symmetric and appropriate for age.  Psych: Cognition and judgment appear intact.  Cooperative with normal attention span and concentration.  Behavior appropriate. No anxious or depressed appearing.     Assessment    ASSESSMENT Hypertensive emergency 06/2018 Hyperthyroidism, per endo Syncope, normal echo 2012 Syncope 06/2018, work-up negative Alcohol abuse DX 06/2018 H/o Hep B: Labs on 7-20 20: HBsAb+, HBsAg- consistent with immunity. The HBV viral  load (DNA) is undetectable Vitamin D deficiency DX 07-2018  PLAN: Here for CPX HTN: On amlodipine, BP is very good.  No change Hypothyroidism: Last visit with Endo 03/11/2019 EtOH: Reports today that he drinks in moderation, only during the weekends. Vitamin D deficiency: S/p ergocalciferol, now on a multivitamin.  Checking levels RTC 6 to 8 months   This visit occurred during the SARS-CoV-2 public health emergency.  Safety protocols were in place, including screening questions prior to the visit, additional usage of staff PPE, and extensive cleaning of exam room while observing appropriate contact time as indicated for disinfecting solutions.

## 2019-04-24 NOTE — Progress Notes (Signed)
Pre visit review using our clinic review tool, if applicable. No additional management support is needed unless otherwise documented below in the visit note. 

## 2019-04-24 NOTE — Patient Instructions (Signed)
  GO TO THE LAB : Get the blood work     GO TO THE FRONT DESK, please reschedule your appointments Come back for a checkup in 6 to 8 months

## 2019-04-25 NOTE — Assessment & Plan Note (Signed)
-  Td  03/30/2016 - Covid vaccination: pro/cons d/w pt, encouraged to proceed  --CCS: iFOB neg 2015 and 2018, 3 options d/w pt ,elevted  IFOB   --DRE PSA wnl October 2020 --Labs: CMP, CBC, vitamin D. -Diet and exercise discussed:

## 2019-04-25 NOTE — Assessment & Plan Note (Signed)
Here for CPX HTN: On amlodipine, BP is very good.  No change Hypothyroidism: Last visit with Endo 03/11/2019 EtOH: Reports today that he drinks in moderation, only during the weekends. Vitamin D deficiency: S/p ergocalciferol, now on a multivitamin.  Checking levels RTC 6 to 8 months

## 2019-04-26 MED ORDER — VITAMIN D (ERGOCALCIFEROL) 1.25 MG (50000 UNIT) PO CAPS: 50000.0000 [IU] | ORAL_CAPSULE | ORAL | 0 refills | Status: DC

## 2019-04-26 NOTE — Addendum Note (Signed)
Addended byConrad Bancroft D on: 04/26/2019 04:39 PM   Modules accepted: Orders

## 2019-04-26 NOTE — Addendum Note (Signed)
Addended byConrad [place] D on: 04/26/2019 04:40 PM   Modules accepted: Orders

## 2019-04-29 ENCOUNTER — Telehealth: Payer: Self-pay | Admitting: Internal Medicine

## 2019-04-29 NOTE — Telephone Encounter (Signed)
Called pt left msg  °

## 2019-04-29 NOTE — Telephone Encounter (Signed)
-----   Message from [place], [place] sent at 04/26/2019  4:42 PM EDT ----- Regarding: Lab appt Needs non-fasting lab appt in 4 weeks please.

## 2019-06-11 ENCOUNTER — Ambulatory Visit: Payer: Managed Care, Other (non HMO) | Admitting: Endocrinology

## 2019-06-27 ENCOUNTER — Encounter: Payer: Self-pay | Admitting: Gastroenterology

## 2019-07-08 ENCOUNTER — Other Ambulatory Visit: Payer: Self-pay | Admitting: Internal Medicine

## 2019-07-08 DIAGNOSIS — R55 Syncope and collapse: Secondary | ICD-10-CM

## 2019-07-10 ENCOUNTER — Other Ambulatory Visit: Payer: Self-pay

## 2019-07-10 ENCOUNTER — Encounter: Payer: Self-pay | Admitting: Endocrinology

## 2019-07-10 ENCOUNTER — Ambulatory Visit (INDEPENDENT_AMBULATORY_CARE_PROVIDER_SITE_OTHER): Payer: PRIVATE HEALTH INSURANCE | Admitting: Endocrinology

## 2019-07-10 VITALS — BP 120/70 | HR 76 | Ht 68.0 in | Wt 116.6 lb

## 2019-07-10 DIAGNOSIS — E0590 Thyrotoxicosis, unspecified without thyrotoxic crisis or storm: Secondary | ICD-10-CM

## 2019-07-10 LAB — TSH: TSH: 1.18 u[IU]/mL (ref 0.35–4.50)

## 2019-07-10 LAB — T4, FREE: Free T4: 0.61 ng/dL (ref 0.60–1.60)

## 2019-07-10 NOTE — Progress Notes (Signed)
Subjective:    Patient ID: Hunter Mitchell, male    DOB: [birth year], 54 y.o.   MRN: [id number]  HPI Pt returns for f/u of mild hyperthyroidism (dx'ed 2012; he has never been on therapy for this; he has never had thyroid imaging; tapazole was chosen as rx, due to mild degree of TSH abnormality).  He takes tapazole as rx'ed.  pt states he feels well in general.   Past Medical History:  Diagnosis Date   Syncope 11-2009   ECHO 01-2010 normal    Past Surgical History:  Procedure Laterality Date   NO PAST SURGERIES      Social History   Socioeconomic History   Marital status: Married    Spouse name: Not on file   Number of children: 3   Years of education: Not on file   Highest education level: Not on file  Occupational History   Occupation: [employer]  Tobacco Use   Smoking status: Never Smoker   Smokeless tobacco: Never Used  Building services engineer Use: Never used  Substance and Sexual Activity   Alcohol use: Yes    Comment: occ beer    Drug use: No   Sexual activity: Not on file  Other Topics Concern   Not on file  Social History Narrative   Household- pt , wife    Children x 3,biological, 4 step children      Social Determinants of Health   Financial Resource Strain:    Difficulty of Paying Living Expenses:   Food Insecurity:    Worried About Programme researcher, broadcasting/film/video in the Last Year:    Barista in the Last Year:   Transportation Needs:    Freight forwarder (Medical):    Lack of Transportation (Non-Medical):   Physical Activity:    Days of Exercise per Week:    Minutes of Exercise per Session:   Stress:    Feeling of Stress :   Social Connections:    Frequency of Communication with Friends and Family:    Frequency of Social Gatherings with Friends and Family:    Attends Religious Services:    Active Member of Clubs or Organizations:    Attends Engineer, structural:    Marital Status:   Intimate Partner  Violence:    Fear of Current or Ex-Partner:    Emotionally Abused:    Physically Abused:    Sexually Abused:     Current Outpatient Medications on File Prior to Visit  Medication Sig Dispense Refill   amLODipine (NORVASC) 10 MG tablet Take 1 tablet (10 mg total) by mouth at bedtime. 90 tablet 1   Ascorbic Acid (VITAMIN C PO) Take 1 tablet by mouth daily.     b complex vitamins tablet Take 1 tablet by mouth daily.     Docosahexaenoic Acid (DHA PO) Take 1 tablet by mouth daily.     escitalopram (LEXAPRO) 10 MG tablet Take 1 tablet (10 mg total) by mouth daily. 90 tablet 1   methimazole (TAPAZOLE) 5 MG tablet Take 1 tablet (5 mg total) by mouth 3 (three) times a week. 45 tablet 1   Multiple Vitamin (MULTIVITAMIN WITH MINERALS) TABS tablet Take 1 tablet by mouth daily.     Omega-3 Fatty Acids (FISH OIL PO) Take 1 capsule by mouth daily.     Vitamin D, Ergocalciferol, (DRISDOL) 1.25 MG (50000 UNIT) CAPS capsule Take 1 capsule (50,000 Units total) by mouth every 7 (seven) days. 12 capsule  0   No current facility-administered medications on file prior to visit.    No Known Allergies  Family History  Problem Relation Age of Onset   Colon cancer Neg Hx    Breast cancer Neg Hx    Prostate cancer Neg Hx    Heart attack Neg Hx    Diabetes Neg Hx    Thyroid disease Neg Hx     BP 120/70    Pulse 76    Ht 5\' 8"  (1.727 m)    Wt 116 lb 9.6 oz (52.9 kg)    SpO2 99%    BMI 17.73 kg/m    Review of Systems Denies fever    Objective:   Physical Exam VITAL SIGNS:  See vs page GENERAL: no distress NECK: There is no palpable thyroid enlargement.  No thyroid nodule is palpable.  No palpable lymphadenopathy at the anterior neck.   Lab Results  Component Value Date   TSH 1.18 07/10/2019      Assessment & Plan:  Hyperthyroidism: well-controlled.  Please continue the same medication Please come back for a follow-up appointment in 3 months

## 2019-07-10 NOTE — Patient Instructions (Signed)
Blood tests are requested for you today.  We'll let you know about the results.   °If ever you have fever while taking methimazole, stop it and call us, even if the reason is obvious, because of the risk of a rare side-effect.   °It is best to never miss the medication.  However, if you do miss it, next best is to double up the next time.   °Please come back for a follow-up appointment in 3 months.   °

## 2019-09-12 ENCOUNTER — Encounter: Payer: Managed Care, Other (non HMO) | Admitting: Gastroenterology

## 2019-10-11 ENCOUNTER — Encounter: Payer: Self-pay | Admitting: Endocrinology

## 2019-10-11 ENCOUNTER — Ambulatory Visit: Payer: PRIVATE HEALTH INSURANCE | Admitting: Endocrinology

## 2019-10-11 ENCOUNTER — Other Ambulatory Visit: Payer: Self-pay

## 2019-10-11 VITALS — BP 120/78 | HR 76 | Ht 68.0 in | Wt 120.0 lb

## 2019-10-11 DIAGNOSIS — E0590 Thyrotoxicosis, unspecified without thyrotoxic crisis or storm: Secondary | ICD-10-CM

## 2019-10-11 LAB — TSH: TSH: 1.11 u[IU]/mL (ref 0.35–4.50)

## 2019-10-11 LAB — T4, FREE: Free T4: 0.65 ng/dL (ref 0.60–1.60)

## 2019-10-11 NOTE — Progress Notes (Signed)
Subjective:    Patient ID: Hunter Mitchell, male    DOB: [birth year], 54 y.o.   MRN: [id number]  HPI Pt returns for f/u of hyperthyroidism (dx'ed 2012; he has never been on therapy for this; he has never had thyroid imaging; tapazole was chosen as rx, due to mild degree of TSH abnormality).  He takes tapazole as rx'ed.  pt states he feels well in general.   Past Medical History:  Diagnosis Date  . Syncope 11-2009   ECHO 01-2010 normal    Past Surgical History:  Procedure Laterality Date  . NO PAST SURGERIES      Social History   Socioeconomic History  . Marital status: Married    Spouse name: Not on file  . Number of children: 3  . Years of education: Not on file  . Highest education level: Not on file  Occupational History  . Occupation: [employer]  Tobacco Use  . Smoking status: Never Smoker  . Smokeless tobacco: Never Used  Vaping Use  . Vaping Use: Never used  Substance and Sexual Activity  . Alcohol use: Yes    Comment: occ beer   . Drug use: No  . Sexual activity: Not on file  Other Topics Concern  . Not on file  Social History Narrative   Household- pt , wife    Children x 3,biological, 4 step children      Social Determinants of Health   Financial Resource Strain:   . Difficulty of Paying Living Expenses: Not on file  Food Insecurity:   . Worried About Programme researcher, broadcasting/film/video in the Last Year: Not on file  . Ran Out of Food in the Last Year: Not on file  Transportation Needs:   . Lack of Transportation (Medical): Not on file  . Lack of Transportation (Non-Medical): Not on file  Physical Activity:   . Days of Exercise per Week: Not on file  . Minutes of Exercise per Session: Not on file  Stress:   . Feeling of Stress : Not on file  Social Connections:   . Frequency of Communication with Friends and Family: Not on file  . Frequency of Social Gatherings with Friends and Family: Not on file  . Attends Religious Services: Not on file  . Active Member of  Clubs or Organizations: Not on file  . Attends Banker Meetings: Not on file  . Marital Status: Not on file  Intimate Partner Violence:   . Fear of Current or Ex-Partner: Not on file  . Emotionally Abused: Not on file  . Physically Abused: Not on file  . Sexually Abused: Not on file    Current Outpatient Medications on File Prior to Visit  Medication Sig Dispense Refill  . amLODipine (NORVASC) 10 MG tablet Take 1 tablet (10 mg total) by mouth at bedtime. 90 tablet 1  . Ascorbic Acid (VITAMIN C PO) Take 1 tablet by mouth daily.    [name] b complex vitamins tablet Take 1 tablet by mouth daily.    . Docosahexaenoic Acid (DHA PO) Take 1 tablet by mouth daily.    [name] escitalopram (LEXAPRO) 10 MG tablet Take 1 tablet (10 mg total) by mouth daily. 90 tablet 1  . methimazole (TAPAZOLE) 5 MG tablet Take 1 tablet (5 mg total) by mouth 3 (three) times a week. 45 tablet 1  . Multiple Vitamin (MULTIVITAMIN WITH MINERALS) TABS tablet Take 1 tablet by mouth daily.    . Omega-3 Fatty Acids (FISH OIL  PO) Take 1 capsule by mouth daily.    . Vitamin D, Ergocalciferol, (DRISDOL) 1.25 MG (50000 UNIT) CAPS capsule Take 1 capsule (50,000 Units total) by mouth every 7 (seven) days. 12 capsule 0   No current facility-administered medications on file prior to visit.    No Known Allergies  Family History  Problem Relation Age of Onset  . Colon cancer Neg Hx   . Breast cancer Neg Hx   . Prostate cancer Neg Hx   . Heart attack Neg Hx   . Diabetes Neg Hx   . Thyroid disease Neg Hx     BP 120/78   Pulse 76   Ht 5\' 8"  (1.727 m)   Wt 120 lb (54.4 kg)   SpO2 98%   BMI 18.25 kg/m    Review of Systems Denies fever    Objective:   Physical Exam VITAL SIGNS:  See vs page GENERAL: no distress NECK: There is no palpable thyroid enlargement.  No thyroid nodule is palpable.  No palpable lymphadenopathy at the anterior neck.    Lab Results  Component Value Date   TSH 1.11 10/11/2019        Assessment & Plan:  Hyperthyroidism: well-controlled.  Please continue the same methimazole

## 2019-10-11 NOTE — Patient Instructions (Signed)
Blood tests are requested for you today.  We'll let you know about the results.   If ever you have fever while taking methimazole, stop it and call us, even if the reason is obvious, because of the risk of a rare side-effect.  It is best to never miss the medication.  However, if you do miss it, next best is to double up the next time.   Please come back for a follow-up appointment in 4 months.   

## 2019-11-10 ENCOUNTER — Ambulatory Visit (INDEPENDENT_AMBULATORY_CARE_PROVIDER_SITE_OTHER): Payer: PRIVATE HEALTH INSURANCE

## 2019-11-10 ENCOUNTER — Ambulatory Visit (HOSPITAL_COMMUNITY)
Admission: EM | Admit: 2019-11-10 | Discharge: 2019-11-10 | Disposition: A | Discharge status: Home / Self Care | Payer: PRIVATE HEALTH INSURANCE | Attending: Family Medicine | Admitting: Family Medicine

## 2019-11-10 ENCOUNTER — Other Ambulatory Visit: Payer: Self-pay

## 2019-11-10 ENCOUNTER — Encounter (HOSPITAL_COMMUNITY): Payer: Self-pay

## 2019-11-10 DIAGNOSIS — M79671 Pain in right foot: Secondary | ICD-10-CM

## 2019-11-10 DIAGNOSIS — M7989 Other specified soft tissue disorders: Secondary | ICD-10-CM | POA: Diagnosis not present

## 2019-11-10 MED ORDER — METHYLPREDNISOLONE 4 MG PO TBPK
ORAL_TABLET | ORAL | 0 refills | Status: DC
Start: 2019-11-10 — End: 2019-11-25

## 2019-11-10 NOTE — Discharge Instructions (Signed)
Stay off the foot for a couple of days Elevate to reduce pain and swelling Take the steroid pack as directed.  Take all of day 1 today.  (3 now and 3 at bedtime) You should see improvement over the next 24 hours See your primary care doctor if you fail to improve

## 2019-11-10 NOTE — ED Provider Notes (Addendum)
[facility]    CSN: [id number] Arrival date & time: 11/10/19  1507      History   Chief Complaint Chief Complaint  Patient presents with  . Foot Pain    right     HPI Hunter Mitchell is a 54 y.o. male.   HPI  Patient states he had no accident or injury.  He wears a normal lace up sneaker at work.  He states he is working long shifts, and a lot of overtime recently.  Has been on his feet a lot.  He states he is developed pain in his right foot.  It is swollen.  It is painful with weightbearing.  He has never had problems with this for the past.  He has never had a history of gout.  Past Medical History:  Diagnosis Date  . Syncope 11-2009   ECHO 01-2010 normal    Patient Active Problem List   Diagnosis Date Noted  . Anxiety 07/26/2018  . Alcohol abuse 07/14/2018  . Syncope 07/10/2018  . PCP NOTES >>>>>>>>>>>> 03/31/2016  . Hyperthyroidism 12/04/2014  . Annual physical exam 11/22/2013  . Fatigue 08/05/2010  . Libido, decreased 08/05/2010  . Hypertension 01/05/2010    Past Surgical History:  Procedure Laterality Date  . NO PAST SURGERIES         Home Medications    Prior to Admission medications   Medication Sig Start Date End Date Taking? Authorizing Provider  amLODipine (NORVASC) 10 MG tablet Take 1 tablet (10 mg total) by mouth at bedtime. 03/20/19  Yes Paz, Nolon Rod, MD  Ascorbic Acid (VITAMIN C PO) Take 1 tablet by mouth daily.   Yes [provider]  Docosahexaenoic Acid (DHA PO) Take 1 tablet by mouth daily.   Yes [provider]  escitalopram (LEXAPRO) 10 MG tablet Take 1 tablet (10 mg total) by mouth daily. 03/20/19  Yes Paz, Nolon Rod, MD  methimazole (TAPAZOLE) 5 MG tablet Take 1 tablet (5 mg total) by mouth 3 (three) times a week. 03/11/19  Yes Romero Belling, MD  Multiple Vitamin (MULTIVITAMIN WITH MINERALS) TABS tablet Take 1 tablet by mouth daily.   Yes [provider]  Omega-3 Fatty Acids (FISH OIL PO) Take 1  capsule by mouth daily.   Yes [provider]  b complex vitamins tablet Take 1 tablet by mouth daily.    [provider]  methylPREDNISolone (MEDROL DOSEPAK) 4 MG TBPK tablet tad 11/10/19   Eustace Moore, MD  Vitamin D, Ergocalciferol, (DRISDOL) 1.25 MG (50000 UNIT) CAPS capsule Take 1 capsule (50,000 Units total) by mouth every 7 (seven) days. 04/26/19   Wanda Plump, MD    Family History Family History  Problem Relation Age of Onset  . Colon cancer Neg Hx   . Breast cancer Neg Hx   . Prostate cancer Neg Hx   . Heart attack Neg Hx   . Diabetes Neg Hx   . Thyroid disease Neg Hx     Social History Social History   Tobacco Use  . Smoking status: Never Smoker  . Smokeless tobacco: Never Used  Vaping Use  . Vaping Use: Never used  Substance Use Topics  . Alcohol use: Yes    Comment: occ beer   . Drug use: No     Allergies   Patient has no known allergies.   Review of Systems Review of Systems  See HPI Physical Exam Triage Vital Signs ED Triage Vitals  Enc Vitals Group  BP 11/10/19 1604 128/77     Pulse Rate 11/10/19 1604 69     Resp 11/10/19 1604 18     Temp 11/10/19 1604 97.8 F (36.6 C)     Temp Source 11/10/19 1604 Oral     SpO2 11/10/19 1604 100 %     Weight --      Height --      Head Circumference --      Peak Flow --      Pain Score 11/10/19 1601 8     Pain Loc --      Pain Edu? --      Excl. in GC? --    No data found.  Updated Vital Signs BP 128/77 (BP Location: Right Arm)   Pulse 69   Temp 97.8 F (36.6 C) (Oral)   Resp 18   SpO2 100%     Physical Exam Constitutional:      General: He is not in acute distress.    Appearance: He is well-developed.  HENT:     Head: Normocephalic and atraumatic.     Mouth/Throat:     Comments: Mask is in place Eyes:     Conjunctiva/sclera: Conjunctivae normal.     Pupils: Pupils are equal, round, and reactive to light.  Cardiovascular:     Rate and Rhythm: Normal rate.    Pulmonary:     Effort: Pulmonary effort is normal. No respiratory distress.  Abdominal:     General: There is no distension.     Palpations: Abdomen is soft.  Musculoskeletal:        General: Normal range of motion.     Cervical back: Normal range of motion.       Feet:  Skin:    General: Skin is warm and dry.  Neurological:     Mental Status: He is alert.     Gait: Gait abnormal.      UC Treatments / Results  Labs (all labs ordered are listed, but only abnormal results are displayed) Labs Reviewed - No data to display  EKG   Radiology DG Foot Complete Right  Result Date: 11/10/2019 CLINICAL DATA:  Acute RIGHT foot pain and swelling for 2 days. EXAM: RIGHT FOOT COMPLETE - 3+ VIEW COMPARISON:  None. FINDINGS: No acute fracture, subluxation or dislocation identified. Dorsal soft tissue swelling is present. No focal bony lesions are identified. IMPRESSION: Soft tissue swelling without acute bony abnormality. Electronically Signed   By: Harmon Pier M.D.   On: 11/10/2019 17:04    Procedures Procedures (including critical care time)  Medications Ordered in UC Medications - No data to display  Initial Impression / Assessment and Plan / UC Course  I have reviewed the triage vital signs and the nursing notes.  Pertinent labs & imaging results that were available during my care of the patient were reviewed by me and considered in my medical decision making (see chart for details).     X-ray was done to rule out stress fracture.  I am aware that stress fractures do not show well on plain films, initially.[name]  He is told that he may need additional scanning his foot pain persists.  Will treat with anti-inflammatories and rest for now. Final Clinical Impressions(s) / UC Diagnoses   Final diagnoses:  Foot pain, right     Discharge Instructions     Stay off the foot for a couple of days Elevate to reduce pain and swelling Take the steroid pack as directed.  Take all of day  1 today.  (3 now and 3 at bedtime) You should see improvement over the next 24 hours See your primary care doctor if you fail to improve    ED Prescriptions    Medication Sig Dispense Auth. Provider   methylPREDNISolone (MEDROL DOSEPAK) 4 MG TBPK tablet tad 21 tablet Eustace Moore, MD     PDMP not reviewed this encounter.   Eustace Moore, MD 11/10/19 1735    Eustace Moore, MD 11/10/19 1736

## 2019-11-10 NOTE — ED Triage Notes (Signed)
Pt reports having right foot pain with swelling on Friday.

## 2019-11-25 ENCOUNTER — Encounter: Payer: Self-pay | Admitting: Internal Medicine

## 2019-11-25 ENCOUNTER — Ambulatory Visit (INDEPENDENT_AMBULATORY_CARE_PROVIDER_SITE_OTHER): Payer: PRIVATE HEALTH INSURANCE | Admitting: Internal Medicine

## 2019-11-25 ENCOUNTER — Other Ambulatory Visit: Payer: Self-pay

## 2019-11-25 VITALS — BP 149/81 | HR 83 | Temp 98.4°F | Resp 16 | Ht 68.0 in | Wt 121.1 lb

## 2019-11-25 DIAGNOSIS — M109 Gout, unspecified: Secondary | ICD-10-CM

## 2019-11-25 DIAGNOSIS — E559 Vitamin D deficiency, unspecified: Secondary | ICD-10-CM | POA: Diagnosis not present

## 2019-11-25 DIAGNOSIS — I10 Essential (primary) hypertension: Secondary | ICD-10-CM

## 2019-11-25 DIAGNOSIS — F419 Anxiety disorder, unspecified: Secondary | ICD-10-CM | POA: Diagnosis not present

## 2019-11-25 LAB — COMPREHENSIVE METABOLIC PANEL
ALT: 21 U/L (ref 0–53)
AST: 45 U/L — ABNORMAL HIGH (ref 0–37)
Albumin: 4.3 g/dL (ref 3.5–5.2)
Alkaline Phosphatase: 61 U/L (ref 39–117)
BUN: 16 mg/dL (ref 6–23)
CO2: 29 mEq/L (ref 19–32)
Calcium: 8.8 mg/dL (ref 8.4–10.5)
Chloride: 98 mEq/L (ref 96–112)
Creatinine, Ser: 0.63 mg/dL (ref 0.40–1.50)
GFR: 109.12 mL/min (ref >60.00)
Glucose, Bld: 82 mg/dL (ref 70–99)
Potassium: 3.7 mEq/L (ref 3.5–5.1)
Sodium: 139 mEq/L (ref 135–145)
Total Bilirubin: 0.4 mg/dL (ref 0.2–1.2)
Total Protein: 7.2 g/dL (ref 6.0–8.3)

## 2019-11-25 LAB — CBC WITH DIFFERENTIAL/PLATELET
Basophils Absolute: 0.1 10*3/uL (ref 0.0–0.1)
Basophils Relative: 2.4 % (ref 0.0–3.0)
Eosinophils Absolute: 0 10*3/uL (ref 0.0–0.7)
Eosinophils Relative: 1 % (ref 0.0–5.0)
HCT: 37.9 % — ABNORMAL LOW (ref 39.0–52.0)
Hemoglobin: 12.8 g/dL — ABNORMAL LOW (ref 13.0–17.0)
Lymphocytes Relative: 24 % (ref 12.0–46.0)
Lymphs Abs: 0.9 10*3/uL (ref 0.7–4.0)
MCHC: 33.7 g/dL (ref 30.0–36.0)
MCV: 88.8 fl (ref 78.0–100.0)
Monocytes Absolute: 0.4 10*3/uL (ref 0.1–1.0)
Monocytes Relative: 11.3 % (ref 3.0–12.0)
Neutro Abs: 2.2 10*3/uL (ref 1.4–7.7)
Neutrophils Relative %: 61.3 % (ref 43.0–77.0)
Platelets: 216 10*3/uL (ref 150.0–400.0)
RBC: 4.27 Mil/uL (ref 4.22–5.81)
RDW: 15.3 % (ref 11.5–15.5)
WBC: 3.6 10*3/uL — ABNORMAL LOW (ref 4.0–10.5)

## 2019-11-25 LAB — URIC ACID: Uric Acid, Serum: 5.4 mg/dL (ref 4.0–7.8)

## 2019-11-25 LAB — VITAMIN D 25 HYDROXY (VIT D DEFICIENCY, FRACTURES): VITD: 26.53 ng/mL — ABNORMAL LOW (ref 30.00–100.00)

## 2019-11-25 NOTE — Assessment & Plan Note (Signed)
HTN: On amlodipine, BP slightly elevated today, reported normal readings at home.  Check a CMP, CBC. Vitamin D deficiency: Took ergocalciferol 04-2019, currently on no supplements.  Recommend vitamin D 2000 units daily, checking levels Hyperthyroidism, saw Endo 10/11/2019.  On methimazole, checking a CMP and CBC EtOH: Reports he drinks 1 or 2 beers twice a week. Anxiety: Controlled on Lexapro Right foot pain and swelling: X-ray @ the UC few days ago was (-), DDx stress fracture despite (-) x-ray, gout, or others.  Check a uric acid, if he is not continue improving let me know. Preventive care: Recommend COVID vaccinations & flu shot.  Declined . RTC CPX 04-2019

## 2019-11-25 NOTE — Patient Instructions (Addendum)
Check the  blood pressure 2 or 3 times a month   BP GOAL is between 110/65 and  135/85. If it is consistently higher or lower, let me know   Take vitamin D over-the-counter, 2000 units every day   GO TO THE LAB : Get the blood work     GO TO THE FRONT DESK, PLEASE SCHEDULE YOUR APPOINTMENTS Come back for a physical exam by April 2022

## 2019-11-25 NOTE — Progress Notes (Signed)
Pre visit review using our clinic review tool, if applicable. No additional management support is needed unless otherwise documented below in the visit note. 

## 2019-11-25 NOTE — Progress Notes (Signed)
Subjective:    Patient ID: Hunter Mitchell, male    DOB: [birth year], 54 y.o.   MRN: [id number]  DOS:  11/25/2019 Type of visit - description: Follow-up HTN: Reports normal ambulatory BPs On 11/10/2019 went to the urgent care with right foot swelling and pain. He does not recall if the area was red or warm. X-ray was negative, prednisone prescribed which helped.  The swelling is not completely resolved. EtOH >> reports moderation.  BP Readings from Last 3 Encounters:  11/25/19 (!) 149/81  11/10/19 128/77  10/11/19 120/78     Review of Systems See above   Past Medical History:  Diagnosis Date  . Syncope 11-2009   ECHO 01-2010 normal    Past Surgical History:  Procedure Laterality Date  . NO PAST SURGERIES      Allergies as of 11/25/2019   No Known Allergies     Medication List       Accurate as of November 25, 2019 10:04 AM. If you have any questions, ask your nurse or doctor.        amLODipine 10 MG tablet Commonly known as: NORVASC Take 1 tablet (10 mg total) by mouth at bedtime.   b complex vitamins tablet Take 1 tablet by mouth daily.   DHA PO Take 1 tablet by mouth daily.   escitalopram 10 MG tablet Commonly known as: LEXAPRO Take 1 tablet (10 mg total) by mouth daily.   FISH OIL PO Take 1 capsule by mouth daily.   methimazole 5 MG tablet Commonly known as: TAPAZOLE Take 1 tablet (5 mg total) by mouth 3 (three) times a week.   methylPREDNISolone 4 MG Tbpk tablet Commonly known as: MEDROL DOSEPAK tad   multivitamin with minerals Tabs tablet Take 1 tablet by mouth daily.   VITAMIN C PO Take 1 tablet by mouth daily.   Vitamin D (Ergocalciferol) 1.25 MG (50000 UNIT) Caps capsule Commonly known as: DRISDOL Take 1 capsule (50,000 Units total) by mouth every 7 (seven) days.          Objective:   Physical Exam BP (!) 149/81 (BP Location: Left Arm, Patient Position: Sitting, Cuff Size: Small)   Pulse 83   Temp 98.4 F (36.9 C) (Oral)    Resp 16   Ht 5\' 8"  (1.727 m)   Wt 121 lb 2 oz (54.9 kg)   SpO2 99%   BMI 18.42 kg/m  General:   Well developed, NAD, BMI noted. HEENT:  Normocephalic . Face symmetric, atraumatic Lungs:  CTA B Normal respiratory effort, no intercostal retractions, no accessory muscle use. Heart: RRR,  no murmur.  Lower extremities:  Left foot normal Right foot: Dorsum of the foot by the second and third toe with some puffiness, mild TTP, no fluctuance, redness, warmness. Skin: Not pale. Not jaundice Neurologic:  alert & oriented X3.  Speech normal, gait appropriate for age and unassisted Psych--  Cognition and judgment appear intact.  Cooperative with normal attention span and concentration.  Behavior appropriate. No anxious or depressed appearing.      Assessment      ASSESSMENT Hypertensive emergency 06/2018 Hyperthyroidism, per endo Anxiety (started lexapro 06/2018) Syncope, normal echo 2012 Syncope 06/2018, work-up negative Alcohol abuse DX 06/2018 H/o Hep B: Labs on 7-20 20: HBsAb+, HBsAg- consistent with immunity. The HBV viral load (DNA) is undetectable Vitamin D deficiency DX 07-2018  PLAN: HTN: On amlodipine, BP slightly elevated today, reported normal readings at home.  Check a CMP, CBC. Vitamin D deficiency:  Took ergocalciferol 04-2019, currently on no supplements.  Recommend vitamin D 2000 units daily, checking levels Hyperthyroidism, saw Endo 10/11/2019.  On methimazole, checking a CMP and CBC EtOH: Reports he drinks 1 or 2 beers twice a week. Anxiety: Controlled on Lexapro Right foot pain and swelling: X-ray @ the UC few days ago was (-), DDx stress fracture despite (-) x-ray, gout, or others.  Check a uric acid, if he is not continue improving let me know. Preventive care: Recommend COVID vaccinations & flu shot.  Declined . RTC CPX 04-2019   This visit occurred during the SARS-CoV-2 public health emergency.  Safety protocols were in place, including screening questions  prior to the visit, additional usage of staff PPE, and extensive cleaning of exam room while observing appropriate contact time as indicated for disinfecting solutions.

## 2019-11-27 MED ORDER — VITAMIN D (ERGOCALCIFEROL) 1.25 MG (50000 UNIT) PO CAPS
50000.0000 [IU] | ORAL_CAPSULE | ORAL | 0 refills | Status: DC
Start: 2019-11-27 — End: 2020-05-12

## 2019-11-27 NOTE — Addendum Note (Signed)
Addended byConrad Timberlake D on: 11/27/2019 07:57 AM   Modules accepted: Orders

## 2019-11-30 ENCOUNTER — Encounter: Payer: Self-pay | Admitting: Internal Medicine

## 2019-12-02 ENCOUNTER — Other Ambulatory Visit: Payer: Self-pay | Admitting: Internal Medicine

## 2019-12-02 ENCOUNTER — Encounter: Payer: Self-pay | Admitting: Internal Medicine

## 2019-12-02 DIAGNOSIS — M79671 Pain in right foot: Secondary | ICD-10-CM

## 2019-12-05 NOTE — Progress Notes (Signed)
Subjective:    I'm seeing this patient as a consultation for Dr. Willow Ora. Note will be routed back to referring provider/PCP.  CC: R foot pain  I, Hunter Mitchell, LAT, ATC, am serving as scribe for Dr. Clementeen Graham. 120.2 HPI: Pt is a 54 y/o male presenting w/ c/o severe R foot pain x 2 weeks w/ no known MOI.  He was seen at the ED on 11/10/19 c/o severe R foot pn. No direct MOI, but pt reported to ED that he has been working long shifts, and a lot of overtime recently.  Has been on his feet a lot. Pt f/u w/ his PCP on 11/25/19, but w/ no improvement was later referred to Sports Medicine. Today, pt reports con't R foot pain that has been worsening.  He locates his pain to his R ant foot in the middle of his foot.   Radiating pain: yes into his R lower leg R foot swelling: yes in his R medial ankle Aggravating factors: walking; prolonged standing Treatments tried: Prednisone, Tylenol  Labs: 11/25/19 Uric acid and CBC Dx imaging: R foot XR- 11/10/19  Past medical history, Surgical history, Family history, Social history, Allergies, and medications have been entered into the medical record, reviewed.   Review of Systems: No new headache, visual changes, nausea, vomiting, diarrhea, constipation, dizziness, abdominal pain, skin rash, fevers, chills, night sweats, weight loss, swollen lymph nodes, body aches, joint swelling, muscle aches, chest pain, shortness of breath, mood changes, visual or auditory hallucinations.   Objective:    Vitals:   12/06/19 1009  BP: 130/72  Pulse: 72  SpO2: 97%   General: Well Developed, well nourished, and in no acute distress.  Neuro/Psych: Alert and oriented x3, extra-ocular muscles intact, able to move all 4 extremities, sensation grossly intact. Skin: Warm and dry, no rashes noted.  Respiratory: Not using accessory muscles, speaking in full sentences, trachea midline.  Cardiovascular: Pulses palpable, no extremity edema. Abdomen: Does not appear  distended. MSK: Right foot swollen dorsal forefoot.  Tender palpation metatarsal shafts 2 and 3. Decreased motion toes otherwise foot and ankle motion normal.  Strength intact foot and ankle.  Pulses cap refill and sensation are intact distally.  Lab and Radiology Results DG Foot Complete Right  Result Date: 11/10/2019 CLINICAL DATA:  Acute RIGHT foot pain and swelling for 2 days. EXAM: RIGHT FOOT COMPLETE - 3+ VIEW COMPARISON:  None. FINDINGS: No acute fracture, subluxation or dislocation identified. Dorsal soft tissue swelling is present. No focal bony lesions are identified. IMPRESSION: Soft tissue swelling without acute bony abnormality. Electronically Signed   By: Harmon Pier M.D.   On: 11/10/2019 17:04   I, Clementeen Graham, personally (independently) visualized and performed the interpretation of the images attached in this note.  X-ray images right foot obtained today personally and independently interpreted Fracture present distal shaft second metatarsal with callus formation.  No angulation or displacement of fracture.   Upon review of original x-ray October 24 this fracture was not visible. Await for radiology review  Impression and Recommendations:    Assessment and Plan: 54 y.o. male with second metatarsal fracture versus converted stress fracture.  He does not have any injury history and I suspect that the fracture that we see now was a's stress fracture or stress reaction earlier and has been converted to a true fracture.  He already has good callus formation and I think this will heal up well.  Already treating appropriately with postop shoe.  However  patient will need to remain absent of work for at least a little while while this heals.  This fracture is not going to be compatible with his heavy duty physically demanding job.  Plan on recheck in 2 weeks.  Work note written for at least 3 weeks out of work.  Recheck or return sooner if needed.[name]  PDMP not reviewed this  encounter. Orders Placed This Encounter  Procedures  . DG Foot Complete Right    Standing Status:   Future    Number of Occurrences:   1    Standing Expiration Date:   12/05/2020    Order Specific Question:   Reason for Exam (SYMPTOM  OR DIAGNOSIS REQUIRED)    Answer:   eval metatarsal stress fracture    Order Specific Question:   Preferred imaging location?    Answer:   [facility]   No orders of the defined types were placed in this encounter.   Discussed warning signs or symptoms. Please see discharge instructions. Patient expresses understanding.   The above documentation has been reviewed and is accurate and complete Clementeen Graham, M.D.

## 2019-12-06 ENCOUNTER — Ambulatory Visit (INDEPENDENT_AMBULATORY_CARE_PROVIDER_SITE_OTHER)
Admission: RE | Admit: 2019-12-06 | Discharge: 2019-12-06 | Disposition: A | Discharge status: Home / Self Care | Payer: PRIVATE HEALTH INSURANCE | Source: Ambulatory Visit | Attending: Family Medicine | Admitting: Family Medicine

## 2019-12-06 ENCOUNTER — Ambulatory Visit: Payer: Self-pay

## 2019-12-06 ENCOUNTER — Ambulatory Visit (INDEPENDENT_AMBULATORY_CARE_PROVIDER_SITE_OTHER): Payer: PRIVATE HEALTH INSURANCE | Admitting: Family Medicine

## 2019-12-06 ENCOUNTER — Encounter: Payer: Self-pay | Admitting: Family Medicine

## 2019-12-06 ENCOUNTER — Other Ambulatory Visit: Payer: Self-pay

## 2019-12-06 VITALS — BP 130/72 | HR 72 | Ht 68.0 in | Wt 120.2 lb

## 2019-12-06 DIAGNOSIS — M79671 Pain in right foot: Secondary | ICD-10-CM

## 2019-12-06 DIAGNOSIS — S92324A Nondisplaced fracture of second metatarsal bone, right foot, initial encounter for closed fracture: Secondary | ICD-10-CM | POA: Diagnosis not present

## 2019-12-06 NOTE — Patient Instructions (Addendum)
Thank you for coming in today.  Please get an Xray today before you leave  I am worried about stress fracture.  If the xray is normal I will do an MRI.   Use the post op shoe until you come back.   Get turf toe insoles to use in your boots soon.   Continue Vit D.   Recheck in about 2 weeks.   Get a Steel Turf Toe insole.  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

## 2019-12-09 NOTE — Progress Notes (Signed)
Xray foot shows a fracture inn the front of the foot where you have pain and swelling. It is already healing. Plan for the post op shoe and turf toe insole that we discussed. Keep the follow up appointment.

## 2019-12-19 NOTE — Progress Notes (Signed)
° °  I, Philbert Riser, LAT, ATC acting as a scribe for Clementeen Graham, MD.  Hunter Mitchell is a 54 y.o. male who presents to [employer] [facility] at [facility] today for f/u R 2nd MT fx. Pt was last seen by Dr. Denyse Amass on 12/06/19 and was advised to continue wearing post-op shoe and remain absent from work. Today, pt reports foot is feeling a bit better, less pain when not walking.  Pt has been compliant in wearing post-op shoe and remains off from work.  He originally is scheduled to return to work on December 10.  He has not tried walking any great distance and has not purchased turf toe insoles yet. Dx imaging: 12/06/19 R foot   Pertinent review of systems: No fevers or chills  Relevant historical information: Hypertension   Exam:  BP 128/78 (BP Location: Right Arm, Patient Position: Sitting, Cuff Size: Normal)    Pulse (!) 120    Ht 5\' 8"  (1.727 m)    Wt 115 lb (52.2 kg)    SpO2 97%    BMI 17.49 kg/m  General: Well Developed, well nourished, and in no acute distress.   MSK: Right foot mildly tender palpation midfoot otherwise nontender.  Normal-appearing normal motion.    Lab and Radiology Results  X-ray images right foot obtained today personally independently interpreted. Callus formation at fracture at 2nd metatarsal improved compared to previous imaging.  Fracture line less evident.  No displacement. Await formal radiology review     Assessment and Plan: 54 y.o. male with right 2nd metatarsal fracture improving both clinically and on x-ray.  Anticipate return to work in 1 to 3 weeks very likely.  Recommend purchasing turf toe insoles and advancing activity as tolerated.  If he feels well enough to try working on December 10 he can however I have written a backup work note to use if he is not able to return to work.  Otherwise recheck in about 3 weeks.   PDMP not reviewed this encounter. Orders Placed This Encounter  Procedures   DG Foot Complete Right     Standing Status:   Future    Number of Occurrences:   1    Standing Expiration Date:   12/19/2020    Order Specific Question:   Reason for Exam (SYMPTOM  OR DIAGNOSIS REQUIRED)    Answer:   right foot pain    Order Specific Question:   Preferred imaging location?    Answer:   14/03/2020   No orders of the defined types were placed in this encounter.    Discussed warning signs or symptoms. Please see discharge instructions. Patient expresses understanding.   The above documentation has been reviewed and is accurate and complete Kyra Searles, M.D.

## 2019-12-20 ENCOUNTER — Other Ambulatory Visit: Payer: Self-pay

## 2019-12-20 ENCOUNTER — Ambulatory Visit (INDEPENDENT_AMBULATORY_CARE_PROVIDER_SITE_OTHER): Payer: PRIVATE HEALTH INSURANCE

## 2019-12-20 ENCOUNTER — Ambulatory Visit (INDEPENDENT_AMBULATORY_CARE_PROVIDER_SITE_OTHER): Payer: PRIVATE HEALTH INSURANCE | Admitting: Family Medicine

## 2019-12-20 VITALS — BP 128/78 | HR 120 | Ht 68.0 in | Wt 115.0 lb

## 2019-12-20 DIAGNOSIS — S92324D Nondisplaced fracture of second metatarsal bone, right foot, subsequent encounter for fracture with routine healing: Secondary | ICD-10-CM | POA: Diagnosis not present

## 2019-12-20 DIAGNOSIS — M79671 Pain in right foot: Secondary | ICD-10-CM

## 2019-12-20 DIAGNOSIS — S92324A Nondisplaced fracture of second metatarsal bone, right foot, initial encounter for closed fracture: Secondary | ICD-10-CM | POA: Insufficient documentation

## 2019-12-20 NOTE — Patient Instructions (Addendum)
Thank you for coming in today.  The fracture looks a lot better.   Use the turf toe insole.   If able to return on Dec 10th to work OK.  If not able remain out of work.  Use the other letter if not able to return.   Recheck with me in 3 weeks (either the 23rd or the 27th).    Get a Steel Turf Toe insole.  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

## 2019-12-23 NOTE — Progress Notes (Signed)
X-ray of foot shows a healing fracture

## 2020-01-08 ENCOUNTER — Telehealth: Payer: Self-pay | Admitting: Family Medicine

## 2020-01-08 NOTE — Telephone Encounter (Signed)
Patient's wife called following up on the paperwork that Dr Denyse Amass was going to complete for him. They were wanting to pick it up tomorrow if possible.  Please advise.  Patient's wife can be reached at [phone]

## 2020-01-08 NOTE — Telephone Encounter (Signed)
I called with spoke with wife. Plan to return to work on 01/16/20. Forms completed and ready for pickup at front desk.

## 2020-01-13 ENCOUNTER — Ambulatory Visit: Payer: PRIVATE HEALTH INSURANCE | Admitting: Family Medicine

## 2020-01-21 NOTE — Progress Notes (Signed)
   I, Hunter Mitchell, LAT, ATC, am serving as scribe for Dr. Clementeen Graham.  Hunter Mitchell is a 55 y.o. male who presents to [facility] at [facility] today for f/u of R 2nd MT fx.  He was last seen by Dr. Denyse Mitchell on 12/20/19 and noted decreased pain in his R foot when NWB. He con't to wear his post-op shoe but had not purchased a turf toe insole yet. He was to remain out of work until 01/16/20.  Since his last visit, pt reports foot has improved. He reports he has the shoe inserts ordered should be arriving this week. Pt reports no pain.  He has been able to return to work without any pain.  Diagnostic imaging: R foot XR- 12/20/19, 12/06/19, 11/10/19  Pertinent review of systems: No fevers or chills  Relevant historical information: Hypothyroidism   Exam:  BP 108/70 (BP Location: Right Arm, Patient Position: Sitting, Cuff Size: Normal)   Pulse 79   Ht 5\' 8"  (1.727 m)   Wt 118 lb (53.5 kg)   SpO2 97%   BMI 17.94 kg/m  General: Well Developed, well nourished, and in no acute distress.   MSK: Right foot nontender normal gait and motion.    Lab and Radiology Results X-ray images right foot obtained today personally and independently interpreted. Excellent callus formation at second metatarsal shaft.  Fracture line barely visible. Await formal radiology review    Assessment and Plan: 55 y.o. male with right foot metatarsal fracture.  Patient has clinically had complete resolution.  X-ray looks great as well.  Recommend using the turf toe insole as needed with heavier duty activity at work in the future.  Recheck back with me as needed.  Discussed precautions and expectations.   PDMP not reviewed this encounter. Orders Placed This Encounter  Procedures  . DG Foot Complete Right    Standing Status:   Future    Number of Occurrences:   1    Standing Expiration Date:   02/21/2020    Order Specific Question:   Reason for Exam (SYMPTOM  OR DIAGNOSIS REQUIRED)     Answer:   R foot pain    Order Specific Question:   Preferred imaging location?    Answer:   04/20/2020   No orders of the defined types were placed in this encounter.    Discussed warning signs or symptoms. Please see discharge instructions. Patient expresses understanding.   The above documentation has been reviewed and is accurate and complete Kyra Searles, M.D.

## 2020-01-22 ENCOUNTER — Ambulatory Visit (INDEPENDENT_AMBULATORY_CARE_PROVIDER_SITE_OTHER): Payer: PRIVATE HEALTH INSURANCE | Admitting: Family Medicine

## 2020-01-22 ENCOUNTER — Other Ambulatory Visit: Payer: Self-pay

## 2020-01-22 ENCOUNTER — Ambulatory Visit (INDEPENDENT_AMBULATORY_CARE_PROVIDER_SITE_OTHER): Payer: PRIVATE HEALTH INSURANCE

## 2020-01-22 ENCOUNTER — Encounter: Payer: Self-pay | Admitting: Family Medicine

## 2020-01-22 VITALS — BP 108/70 | HR 79 | Ht 68.0 in | Wt 118.0 lb

## 2020-01-22 DIAGNOSIS — M79671 Pain in right foot: Secondary | ICD-10-CM

## 2020-01-22 NOTE — Progress Notes (Signed)
X-ray shows fracture is healing

## 2020-01-22 NOTE — Patient Instructions (Signed)
Thank you for coming in today.  Recheck with me as needed.   Use that insert as needed especially with heavy duty at first.   Let me know if you have a problem.

## 2020-02-11 ENCOUNTER — Other Ambulatory Visit: Payer: Self-pay

## 2020-02-11 ENCOUNTER — Ambulatory Visit (INDEPENDENT_AMBULATORY_CARE_PROVIDER_SITE_OTHER): Payer: PRIVATE HEALTH INSURANCE | Admitting: Endocrinology

## 2020-02-11 VITALS — BP 130/80 | HR 77 | Ht 68.0 in | Wt 121.2 lb

## 2020-02-11 DIAGNOSIS — E0590 Thyrotoxicosis, unspecified without thyrotoxic crisis or storm: Secondary | ICD-10-CM | POA: Diagnosis not present

## 2020-02-11 LAB — T4, FREE: Free T4: 0.66 ng/dL (ref 0.60–1.60)

## 2020-02-11 LAB — TSH: TSH: 0.73 u[IU]/mL (ref 0.35–4.50)

## 2020-02-11 NOTE — Patient Instructions (Addendum)
Blood tests are requested for you today.  We'll let you know about the results.  If ever you have fever while taking methimazole, stop it and call us, even if the reason is obvious, because of the risk of a rare side-effect. It is best to never miss the medication.  However, if you do miss it, next best is to double up the next time.   Please come back for a follow-up appointment in 6 months.   

## 2020-02-11 NOTE — Progress Notes (Signed)
Subjective:    Patient ID: Hunter Mitchell, male    DOB: 07-Nov-1965, 55 y.o.   MRN: [id number]  HPI Pt returns for f/u of hyperthyroidism (dx'ed 2012; he has never been on therapy for this; he has never had thyroid imaging; tapazole was chosen as rx, due to mild degree of TSH abnormality).  He takes tapazole as rx'ed.  pt states he feels well in general.   Past Medical History:  Diagnosis Date  . Syncope 11-2009   ECHO 01-2010 normal    Past Surgical History:  Procedure Laterality Date  . NO PAST SURGERIES      Social History   Socioeconomic History  . Marital status: Married    Spouse name: Not on file  . Number of children: 3  . Years of education: Not on file  . Highest education level: Not on file  Occupational History  . Occupation: [employer]  Tobacco Use  . Smoking status: Never Smoker  . Smokeless tobacco: Never Used  Vaping Use  . Vaping Use: Never used  Substance and Sexual Activity  . Alcohol use: Yes    Comment: occ beer   . Drug use: No  . Sexual activity: Not on file  Other Topics Concern  . Not on file  Social History Narrative   Household- pt , wife    Children x 3,biological, 4 step children      Social Determinants of Health   Financial Resource Strain: Not on file  Food Insecurity: Not on file  Transportation Needs: Not on file  Physical Activity: Not on file  Stress: Not on file  Social Connections: Not on file  Intimate Partner Violence: Not on file    Current Outpatient Medications on File Prior to Visit  Medication Sig Dispense Refill  . amLODipine (NORVASC) 10 MG tablet Take 1 tablet (10 mg total) by mouth at bedtime. 90 tablet 1  . Ascorbic Acid (VITAMIN C PO) Take 1 tablet by mouth daily.    [name] b complex vitamins tablet Take 1 tablet by mouth daily.    . cholecalciferol (VITAMIN D3) 25 MCG (1000 UNIT) tablet Take 2,000 Units by mouth daily.    . Docosahexaenoic Acid (DHA PO) Take 1 tablet by mouth daily.    . Multiple Vitamin  (MULTIVITAMIN WITH MINERALS) TABS tablet Take 1 tablet by mouth daily.    . Omega-3 Fatty Acids (FISH OIL PO) Take 1 capsule by mouth daily.    . Vitamin D, Ergocalciferol, (DRISDOL) 1.25 MG (50000 UNIT) CAPS capsule Take 1 capsule (50,000 Units total) by mouth every 7 (seven) days. 12 capsule 0   No current facility-administered medications on file prior to visit.    No Known Allergies  Family History  Problem Relation Age of Onset  . Colon cancer Neg Hx   . Breast cancer Neg Hx   . Prostate cancer Neg Hx   . Heart attack Neg Hx   . Diabetes Neg Hx   . Thyroid disease Neg Hx     BP 130/80 (BP Location: Right Arm, Patient Position: Sitting, Cuff Size: Normal)   Pulse 77   Ht 5\' 8"  (1.727 m)   Wt 121 lb 3.2 oz (55 kg)   SpO2 95%   BMI 18.43 kg/m     Review of Systems Denies fever.      Objective:   Physical Exam VITAL SIGNS:  See vs page GENERAL: no distress NECK: There is no palpable thyroid enlargement.  No thyroid nodule is  palpable.  No palpable lymphadenopathy at the anterior neck.   Lab Results  Component Value Date   TSH 0.73 02/11/2020       Assessment & Plan:  Hyperthyroidism: well-controlled. Please continue the same methimazole.

## 2020-02-13 ENCOUNTER — Other Ambulatory Visit: Payer: Self-pay | Admitting: Internal Medicine

## 2020-02-15 MED ORDER — METHIMAZOLE 5 MG PO TABS
5.0000 mg | ORAL_TABLET | ORAL | 1 refills | Status: DC
Start: 2020-02-17 — End: 2020-11-20

## 2020-04-27 ENCOUNTER — Other Ambulatory Visit: Payer: Self-pay

## 2020-04-27 ENCOUNTER — Encounter: Payer: PRIVATE HEALTH INSURANCE | Admitting: Internal Medicine

## 2020-05-12 ENCOUNTER — Other Ambulatory Visit: Payer: Self-pay

## 2020-05-12 ENCOUNTER — Ambulatory Visit (INDEPENDENT_AMBULATORY_CARE_PROVIDER_SITE_OTHER): Payer: PRIVATE HEALTH INSURANCE | Admitting: Internal Medicine

## 2020-05-12 ENCOUNTER — Encounter: Payer: Self-pay | Admitting: Internal Medicine

## 2020-05-12 VITALS — BP 130/70 | HR 81 | Temp 98.2°F | Resp 16 | Ht 68.0 in | Wt 118.5 lb

## 2020-05-12 DIAGNOSIS — I10 Essential (primary) hypertension: Secondary | ICD-10-CM | POA: Diagnosis not present

## 2020-05-12 DIAGNOSIS — E559 Vitamin D deficiency, unspecified: Secondary | ICD-10-CM | POA: Diagnosis not present

## 2020-05-12 DIAGNOSIS — Z0000 Encounter for general adult medical examination without abnormal findings: Secondary | ICD-10-CM | POA: Diagnosis not present

## 2020-05-12 DIAGNOSIS — Z1211 Encounter for screening for malignant neoplasm of colon: Secondary | ICD-10-CM

## 2020-05-12 DIAGNOSIS — E0590 Thyrotoxicosis, unspecified without thyrotoxic crisis or storm: Secondary | ICD-10-CM

## 2020-05-12 LAB — CBC WITH DIFFERENTIAL/PLATELET
Basophils Relative: 0 % (ref 0.0–3.0)
Eosinophils Relative: 0 % (ref 0.0–5.0)
HCT: 39.1 % (ref 39.0–52.0)
Hemoglobin: 13.1 g/dL (ref 13.0–17.0)
Lymphocytes Relative: 38 % (ref 12.0–46.0)
MCHC: 33.5 g/dL (ref 30.0–36.0)
MCV: 88.4 fl (ref 78.0–100.0)
Monocytes Relative: 17 % — ABNORMAL HIGH (ref 3.0–12.0)
Neutrophils Relative %: 45 % (ref 43.0–77.0)
Platelets: 158 10*3/uL (ref 150.0–400.0)
RBC: 4.42 Mil/uL (ref 4.22–5.81)
RDW: 13.7 % (ref 11.5–15.5)
WBC: 2.6 10*3/uL — ABNORMAL LOW (ref 4.0–10.5)

## 2020-05-12 LAB — COMPREHENSIVE METABOLIC PANEL
ALT: 320 U/L — ABNORMAL HIGH (ref 0–53)
AST: 764 U/L — ABNORMAL HIGH (ref 0–37)
Albumin: 4.6 g/dL (ref 3.5–5.2)
Alkaline Phosphatase: 82 U/L (ref 39–117)
BUN: 11 mg/dL (ref 6–23)
CO2: 29 mEq/L (ref 19–32)
Calcium: 9.6 mg/dL (ref 8.4–10.5)
Chloride: 95 mEq/L — ABNORMAL LOW (ref 96–112)
Creatinine, Ser: 0.61 mg/dL (ref 0.40–1.50)
GFR: 109.83 mL/min (ref >60.00)
Glucose, Bld: 131 mg/dL — ABNORMAL HIGH (ref 70–99)
Potassium: 3.6 mEq/L (ref 3.5–5.1)
Sodium: 136 mEq/L (ref 135–145)
Total Bilirubin: 0.7 mg/dL (ref 0.2–1.2)
Total Protein: 8 g/dL (ref 6.0–8.3)

## 2020-05-12 LAB — LIPID PANEL
Cholesterol: 291 mg/dL — ABNORMAL HIGH (ref 0–200)
HDL: 187.4 mg/dL (ref >39.00)
LDL Cholesterol: 97 mg/dL (ref 0–99)
NonHDL: 104.07
Total CHOL/HDL Ratio: 2
Triglycerides: 33 mg/dL (ref 0.0–149.0)
VLDL: 6.6 mg/dL (ref 0.0–40.0)

## 2020-05-12 LAB — PSA: PSA: 3.02 ng/mL (ref 0.10–4.00)

## 2020-05-12 LAB — VITAMIN D 25 HYDROXY (VIT D DEFICIENCY, FRACTURES): VITD: 35.93 ng/mL (ref 30.00–100.00)

## 2020-05-12 NOTE — Progress Notes (Signed)
Subjective:    Patient ID: Hunter Mitchell, male    DOB: [birth year], 55 y.o.   MRN: [id number]  DOS:  05/12/2020 Type of visit - description: CPX Since the last office visit is doing well and has no major concerns. I noted weight loss, he has change nothing, specifically denies diarrhea, blood in the stools, change in bowel habits. No fevers, headaches, night sweats, cough.   Wt Readings from Last 3 Encounters:  05/12/20 118 lb 8 oz (53.8 kg)  02/11/20 121 lb 3.2 oz (55 kg)  01/22/20 118 lb (53.5 kg)    Social History   Socioeconomic History  . Marital status: Married    Spouse name: Not on file  . Number of children: 3  . Years of education: Not on file  . Highest education level: Not on file  Occupational History  . Occupation: [employer]  Tobacco Use  . Smoking status: Never Smoker  . Smokeless tobacco: Never Used  Vaping Use  . Vaping Use: Never used  Substance and Sexual Activity  . Alcohol use: Yes    Comment: occ beer , weekends   . Drug use: No  . Sexual activity: Not on file  Other Topics Concern  . Not on file  Social History Narrative   Household- pt , wife    Children x 3,biological, 4 step children      Social Determinants of Health   Financial Resource Strain: Not on file  Food Insecurity: Not on file  Transportation Needs: Not on file  Physical Activity: Not on file  Stress: Not on file  Social Connections: Not on file  Intimate Partner Violence: Not on file     Review of Systems  Other than above, a 14 point review of systems is negative      Past Medical History:  Diagnosis Date  . Syncope 11-2009   ECHO 01-2010 normal    Past Surgical History:  Procedure Laterality Date  . NO PAST SURGERIES      Allergies as of 05/12/2020   No Known Allergies     Medication List       Accurate as of May 12, 2020 11:59 PM. If you have any questions, ask your nurse or doctor.        STOP taking these medications   Vitamin D  (Ergocalciferol) 1.25 MG (50000 UNIT) Caps capsule Commonly known as: DRISDOL Stopped by: Willow Ora, MD     TAKE these medications   amLODipine 10 MG tablet Commonly known as: NORVASC Take 1 tablet (10 mg total) by mouth at bedtime.   b complex vitamins tablet Take 1 tablet by mouth daily.   cholecalciferol 25 MCG (1000 UNIT) tablet Commonly known as: VITAMIN D3 Take 2,000 Units by mouth daily.   DHA PO Take 1 tablet by mouth daily.   escitalopram 10 MG tablet Commonly known as: LEXAPRO Take 1 tablet (10 mg total) by mouth daily.   FISH OIL PO Take 1 capsule by mouth daily.   methimazole 5 MG tablet Commonly known as: TAPAZOLE Take 1 tablet (5 mg total) by mouth 3 (three) times a week.   multivitamin with minerals Tabs tablet Take 1 tablet by mouth daily.   VITAMIN C PO Take 1 tablet by mouth daily.          Objective:   Physical Exam BP 130/70 (BP Location: Left Arm, Patient Position: Sitting, Cuff Size: Small)   Pulse 81   Temp 98.2 F (36.8 C) (Oral)  Resp 16   Ht 5\' 8"  (1.727 m)   Wt 118 lb 8 oz (53.8 kg)   SpO2 98%   BMI 18.02 kg/m  General: Well developed, NAD, BMI noted Neck: No  thyromegaly  HEENT:  Normocephalic . Face symmetric, atraumatic Lymphatic system: No LAD is at the neck, armpits or groins Lungs:  CTA B Normal respiratory effort, no intercostal retractions, no accessory muscle use. Heart: RRR,  no murmur.  Abdomen:  Not distended, soft, non-tender. No rebound or rigidity. DRE: Normal sphincter tone, no stools, prostate normal Lower extremities: no pretibial edema bilaterally  Skin: Exposed areas without rash. Not pale. Not jaundice Neurologic:  alert & oriented X3.  Speech normal, gait appropriate for age and unassisted Strength symmetric and appropriate for age.  Psych: Cognition and judgment appear intact.  Cooperative with normal attention span and concentration.  Behavior appropriate. No anxious or depressed  appearing.     Assessment     ASSESSMENT Hypertensive emergency 06/2018 Hyperthyroidism, per endo Anxiety (started lexapro 06/2018) CV: --Syncope, normal echo 2012 --Syncope 06/2018, work-up negative Alcohol abuse DX 06/2018 H/o Hep B: Labs on 7-20 20: HBsAb+, HBsAg- consistent with immunity. The HBV viral load (DNA) is undetectable Vitamin D deficiency DX 07-2018  PLAN: Here for CPX HTN: On amlodipine, BP is very good, recommend ambulatory BPs Hyperthyroidism: Last visit with Endo 02/11/2020, TSH was 0.7 Anxiety: Controlled, on Lexapro EtOH abuse, history of: Currently drinking socially per patient. Vitamin D deficiency: S/p ergocalciferol, currently on OTCs, checking labs Weight loss: His weight fluctuates around the 120s, he has been a slim person all his life, his father was very slim.  This is probably his "normal" weight.  Recommend to call if weight continues to decrease, see AVS. RTC 1 year  -Td03/14/2018 - Shingrix shot d/w pt, reluctant to proceed - Covid vaccination: Reluctant to proceed despite extensive discussion of pros/cons. --CCS: iFOB neg 2015 and 2018, we agreed on a GI referral for a colonoscopy --DRE normal, check PSA --Labs:  CMP, FLP, CBC, vitamin D, PSA -Diet and exercise discussed:     This visit occurred during the SARS-CoV-2 public health emergency.  Safety protocols were in place, including screening questions prior to the visit, additional usage of staff PPE, and extensive cleaning of exam room while observing appropriate contact time as indicated for disinfecting solutions.

## 2020-05-12 NOTE — Patient Instructions (Addendum)
Check the  blood pressure once a month BP GOAL is between 110/65 and  135/85. If it is consistently higher or lower, let me know   Please make an appointment to see me in a few lose more than 5 pounds in the next 6 months  Please call the gastroenterology office and set up your colonoscopy  [phone]  Please see Dr. Everardo All regularly   GO TO THE LAB : Get the blood work     GO TO THE FRONT DESK, PLEASE SCHEDULE YOUR APPOINTMENTS Come back for a physical exam in 1 year     "Living will", "Health Care Power of attorney": Advanced care planning  (If you already have a living will or healthcare power of attorney, please bring the copy to be scanned in your chart.)  Advance care planning is a process that supports adults in  understanding and sharing their preferences regarding future medical care.   The patient's preferences are recorded in documents called Advance Directives.    Advanced directives are completed (and can be modified at any time) while the patient is in full mental capacity.   The documentation should be available at all times to the patient, the family and the healthcare providers.  Bring in a copy to be scanned in your chart is an excellent idea and is recommended   This legal documents direct treatment decision making and/or appoint a surrogate to make the decision if the patient is not capable to do so.    Advance directives can be documented in many types of formats,  documents have names such as:  Lliving will  Durable power of attorney for healthcare (healthcare proxy or healthcare power of attorney)  Combined directives  Physician orders for life-sustaining treatment    More information at:  [email or web address]

## 2020-05-13 ENCOUNTER — Encounter: Payer: Self-pay | Admitting: Internal Medicine

## 2020-05-13 ENCOUNTER — Telehealth: Payer: Self-pay | Admitting: Internal Medicine

## 2020-05-13 DIAGNOSIS — R739 Hyperglycemia, unspecified: Secondary | ICD-10-CM

## 2020-05-13 DIAGNOSIS — R7989 Other specified abnormal findings of blood chemistry: Secondary | ICD-10-CM

## 2020-05-13 NOTE — Telephone Encounter (Signed)
Orders placed. Will try calling Pt again tomorrow.

## 2020-05-13 NOTE — Assessment & Plan Note (Signed)
Here for CPX HTN: On amlodipine, BP is very good, recommend ambulatory BPs Hyperthyroidism: Last visit with Endo 02/11/2020, TSH was 0.7 Anxiety: Controlled, on Lexapro EtOH abuse, history of: Currently drinking socially per patient. Vitamin D deficiency: S/p ergocalciferol, currently on OTCs, checking labs Weight loss: His weight fluctuates around the 120s, he has been a slim person all his life, his father was very slim.  This is probably his "normal" weight.  Recommend to call if weight continues to decrease, see AVS. RTC 1 year

## 2020-05-13 NOTE — Telephone Encounter (Signed)
Increased LFTs noted, AST> ALT. CT abdomen 06/2018: Normal Liver. Suspect EtOH is etiology. Leukopenia, not new, on tapazole. Plan: -Complete EtOH abstinence, no Tylenol (if he is taking it).    - Recheck LFTs, PTT, PT in 10 days.  DX increased LFTs. -  Also A1c, DX hyperglycemia - OV in 2 months  Called twice: No answer, please call patient and discuss above.

## 2020-05-13 NOTE — Assessment & Plan Note (Signed)
-  Td03/14/2018 - Shingrix shot d/w pt, reluctant to proceed - Covid vaccination: Reluctant to proceed despite extensive discussion of pros/cons. --CCS: iFOB neg 2015 and 2018, we agreed on a GI referral for a colonoscopy --DRE normal, check PSA --Labs:  CMP, FLP, CBC, vitamin D, PSA -Diet and exercise discussed:

## 2020-05-14 NOTE — Telephone Encounter (Signed)
Spoke w/ Pt- informed of results, Pt denies excessive ETOH usage or Tylenol usage. Lab appt scheduled for 05/29/20 and 2 month f/u scheduled 07/15/20.

## 2020-05-29 ENCOUNTER — Other Ambulatory Visit: Payer: Self-pay | Admitting: Family

## 2020-05-29 ENCOUNTER — Other Ambulatory Visit: Payer: Self-pay

## 2020-05-29 ENCOUNTER — Other Ambulatory Visit (INDEPENDENT_AMBULATORY_CARE_PROVIDER_SITE_OTHER): Payer: PRIVATE HEALTH INSURANCE

## 2020-05-29 ENCOUNTER — Other Ambulatory Visit: Payer: PRIVATE HEALTH INSURANCE

## 2020-05-29 DIAGNOSIS — R7989 Other specified abnormal findings of blood chemistry: Secondary | ICD-10-CM | POA: Diagnosis not present

## 2020-05-29 DIAGNOSIS — R739 Hyperglycemia, unspecified: Secondary | ICD-10-CM | POA: Diagnosis not present

## 2020-05-29 LAB — PROTIME-INR
INR: 0.9 ratio (ref 0.8–1.0)
Prothrombin Time: 10.2 s (ref 9.6–13.1)

## 2020-05-29 LAB — HEPATIC FUNCTION PANEL
ALT: 101 U/L — ABNORMAL HIGH (ref 0–53)
AST: 107 U/L — ABNORMAL HIGH (ref 0–37)
Albumin: 4.8 g/dL (ref 3.5–5.2)
Alkaline Phosphatase: 76 U/L (ref 39–117)
Bilirubin, Direct: 0.1 mg/dL (ref 0.0–0.3)
Total Bilirubin: 0.4 mg/dL (ref 0.2–1.2)
Total Protein: 8 g/dL (ref 6.0–8.3)

## 2020-05-29 LAB — APTT: aPTT: 34.6 s — ABNORMAL HIGH (ref 23.4–32.7)

## 2020-05-29 NOTE — Addendum Note (Signed)
Addended by: Elita Boone E on: 05/29/2020 11:00 AM   Modules accepted: Orders

## 2020-05-30 LAB — HEMOGLOBIN A1C
Hgb A1c MFr Bld: 5.6 % of total Hgb (ref <5.7)
Mean Plasma Glucose: 114 mg/dL
eAG (mmol/L): 6.3 mmol/L

## 2020-07-15 ENCOUNTER — Ambulatory Visit: Payer: PRIVATE HEALTH INSURANCE | Admitting: Internal Medicine

## 2020-07-29 ENCOUNTER — Other Ambulatory Visit: Payer: Self-pay | Admitting: Internal Medicine

## 2020-08-11 ENCOUNTER — Ambulatory Visit (INDEPENDENT_AMBULATORY_CARE_PROVIDER_SITE_OTHER): Payer: PRIVATE HEALTH INSURANCE | Admitting: Endocrinology

## 2020-08-11 ENCOUNTER — Other Ambulatory Visit: Payer: Self-pay

## 2020-08-11 VITALS — BP 122/68 | HR 87 | Ht 68.0 in | Wt 117.0 lb

## 2020-08-11 DIAGNOSIS — E0590 Thyrotoxicosis, unspecified without thyrotoxic crisis or storm: Secondary | ICD-10-CM

## 2020-08-11 LAB — TSH: TSH: 0.4 u[IU]/mL (ref 0.35–5.50)

## 2020-08-11 LAB — T4, FREE: Free T4: 0.59 ng/dL — ABNORMAL LOW (ref 0.60–1.60)

## 2020-08-11 NOTE — Patient Instructions (Signed)
Blood tests are requested for you today.  We'll let you know about the results.  If ever you have fever while taking methimazole, stop it and call us, even if the reason is obvious, because of the risk of a rare side-effect. It is best to never miss the medication.  However, if you do miss it, next best is to double up the next time.   Please come back for a follow-up appointment in 6 months.   

## 2020-08-11 NOTE — Progress Notes (Signed)
Subjective:    Patient ID: Hunter Mitchell, male    DOB: 10-17-1965, 55 y.o.   MRN: [id number]  HPI Pt returns for f/u of hyperthyroidism (dx'ed 2012; he has never been on therapy for this; he has never had thyroid imaging; tapazole was chosen as rx, due to mild degree of TSH abnormality). pt states he feels well in general.  He takes tapazole as rx'ed.   Past Medical History:  Diagnosis Date   Syncope 11-2009   ECHO 01-2010 normal    Past Surgical History:  Procedure Laterality Date   NO PAST SURGERIES      Social History   Socioeconomic History   Marital status: Married    Spouse name: Not on file   Number of children: 3   Years of education: Not on file   Highest education level: Not on file  Occupational History   Occupation: [employer]  Tobacco Use   Smoking status: Never   Smokeless tobacco: Never  Vaping Use   Vaping Use: Never used  Substance and Sexual Activity   Alcohol use: Yes    Comment: occ beer , weekends    Drug use: No   Sexual activity: Not on file  Other Topics Concern   Not on file  Social History Narrative   Household- pt , wife    Children x 3,biological, 4 step children      Social Determinants of Health   Financial Resource Strain: Not on file  Food Insecurity: Not on file  Transportation Needs: Not on file  Physical Activity: Not on file  Stress: Not on file  Social Connections: Not on file  Intimate Partner Violence: Not on file    Current Outpatient Medications on File Prior to Visit  Medication Sig Dispense Refill   amLODipine (NORVASC) 10 MG tablet Take 1 tablet (10 mg total) by mouth at bedtime. 90 tablet 2   Ascorbic Acid (VITAMIN C PO) Take 1 tablet by mouth daily.     b complex vitamins tablet Take 1 tablet by mouth daily.     cholecalciferol (VITAMIN D3) 25 MCG (1000 UNIT) tablet Take 2,000 Units by mouth daily.     Docosahexaenoic Acid (DHA PO) Take 1 tablet by mouth daily.     escitalopram (LEXAPRO) 10 MG tablet Take  1 tablet (10 mg total) by mouth daily. 90 tablet 1   methimazole (TAPAZOLE) 5 MG tablet Take 1 tablet (5 mg total) by mouth 3 (three) times a week. 45 tablet 1   Multiple Vitamin (MULTIVITAMIN WITH MINERALS) TABS tablet Take 1 tablet by mouth daily.     Omega-3 Fatty Acids (FISH OIL PO) Take 1 capsule by mouth daily.     No current facility-administered medications on file prior to visit.    No Known Allergies  Family History  Problem Relation Age of Onset   Colon cancer Neg Hx    Breast cancer Neg Hx    Prostate cancer Neg Hx    Heart attack Neg Hx    Diabetes Neg Hx    Thyroid disease Neg Hx     BP 122/68 (BP Location: Right Arm, Patient Position: Sitting, Cuff Size: Normal)   Pulse 87   Ht 5\' 8"  (1.727 m)   Wt 117 lb (53.1 kg)   SpO2 96%   BMI 17.79 kg/m   Review of Systems Denies fever    Objective:   Physical Exam VITAL SIGNS:  See vs page GENERAL: no distress NECK: thyroid is slightly  and diffusely enlarged.    Lab Results  Component Value Date   TSH 0.40 08/11/2020       Assessment & Plan:  Hyperthyroidism: well-controlled.  Please continue the same methimazole.

## 2020-08-16 ENCOUNTER — Encounter: Payer: Self-pay | Admitting: Internal Medicine

## 2020-08-31 ENCOUNTER — Ambulatory Visit (HOSPITAL_BASED_OUTPATIENT_CLINIC_OR_DEPARTMENT_OTHER): Payer: PRIVATE HEALTH INSURANCE

## 2020-11-20 ENCOUNTER — Telehealth: Payer: Self-pay | Admitting: Endocrinology

## 2020-11-20 DIAGNOSIS — E0590 Thyrotoxicosis, unspecified without thyrotoxic crisis or storm: Secondary | ICD-10-CM

## 2020-11-20 MED ORDER — METHIMAZOLE 5 MG PO TABS: 5.0000 mg | ORAL_TABLET | ORAL | 1 refills | Status: DC

## 2020-11-20 NOTE — Telephone Encounter (Signed)
Called pt no answer left vm 

## 2020-11-20 NOTE — Telephone Encounter (Signed)
Spouse called in (twice) requesting refill for methimazole (TAPAZOLE) 5 MG tablet   Pt has none left   [address] [place], [place] - [address] [facility] [address]  [address] [place], [place] [address]   Pt contact [phone]

## 2020-12-26 IMAGING — CT CT ABDOMEN AND PELVIS WITHOUT CONTRAST
2 of 4 series · 17 of 46 positions shown, 19 images · non-contrast
Comparison: None.

CLINICAL DATA: Unintended weight loss. Nonlocalized abdominal pain.

EXAM:
CT ABDOMEN AND PELVIS WITHOUT CONTRAST
TECHNIQUE: Multidetector CT imaging of the abdomen and pelvis was performed
following the standard protocol without IV contrast.

[Series 3: a/p w/o 5mm · axial · non-contrast · 0.64mm/px · z∈[+873,+1253]mm · 14 of 84 slices shown, 16 images]
[im 4/84  soft-tissue]
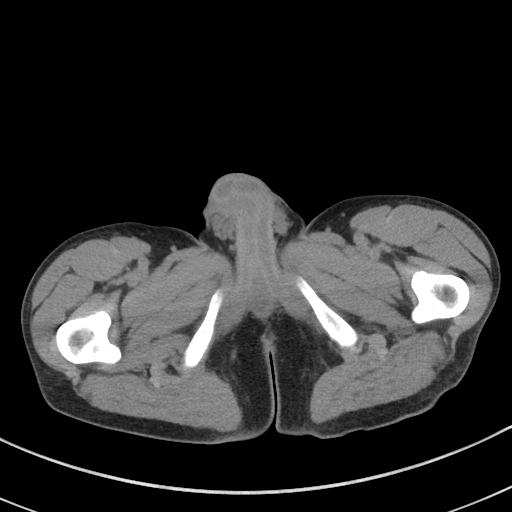
[im 4/84  bone]
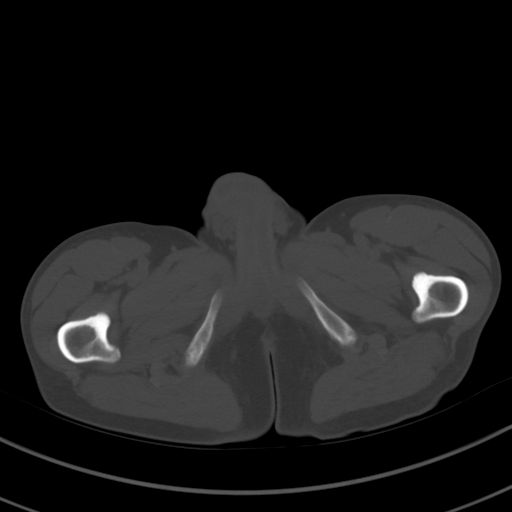
[im 10/84  soft-tissue]
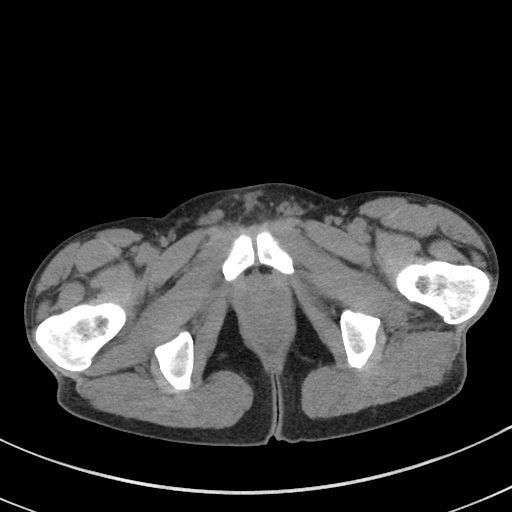
[im 16/84  soft-tissue]
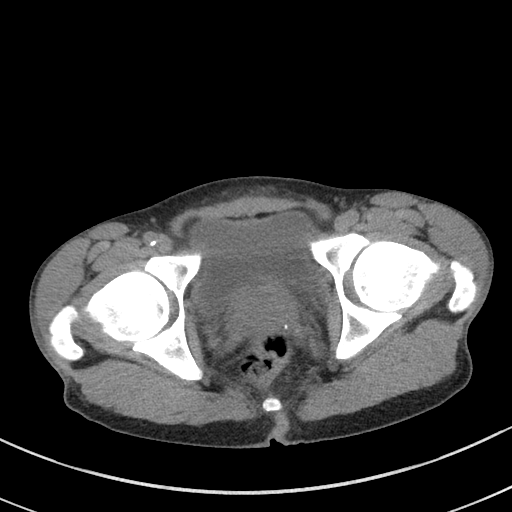
[im 23/84  soft-tissue]
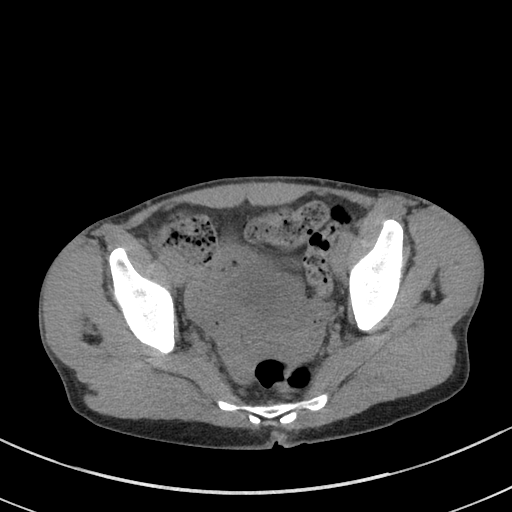
[im 29/84  soft-tissue]
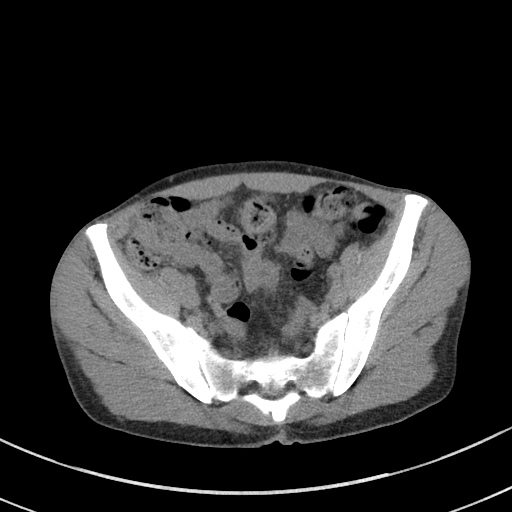
[im 32/84  soft-tissue]
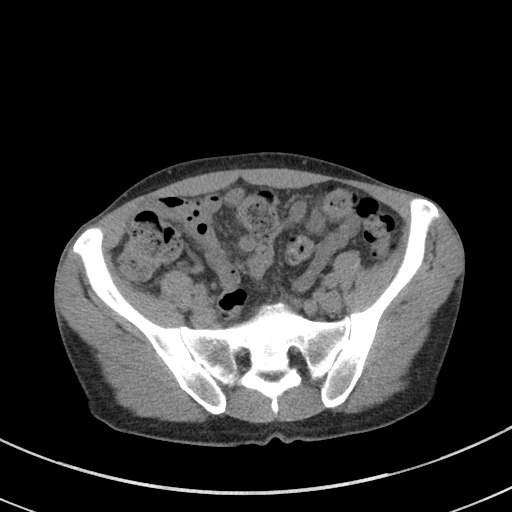
[im 39/84  soft-tissue]
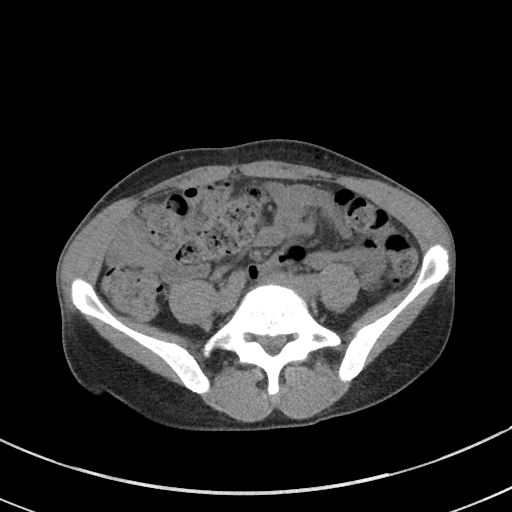
[im 45/84  soft-tissue]
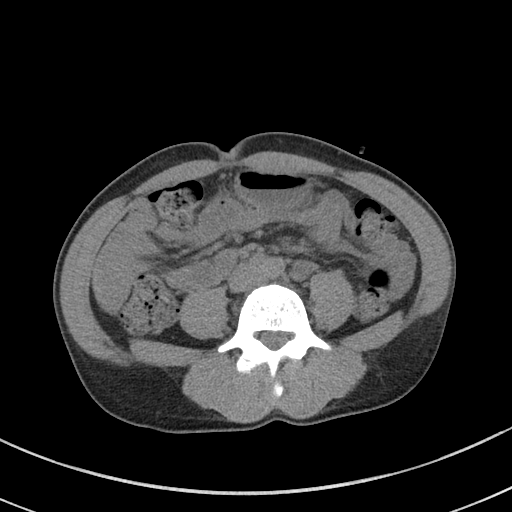
[im 52/84  soft-tissue]
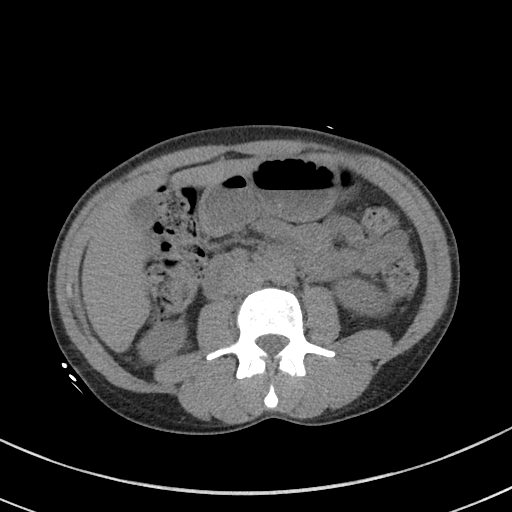
[im 52/84  bone]
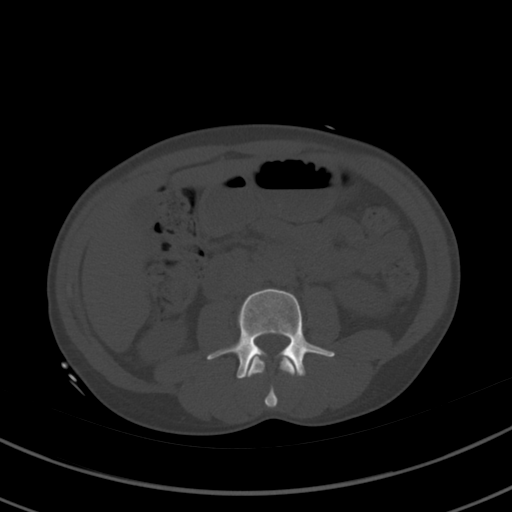
[im 55/84  soft-tissue]
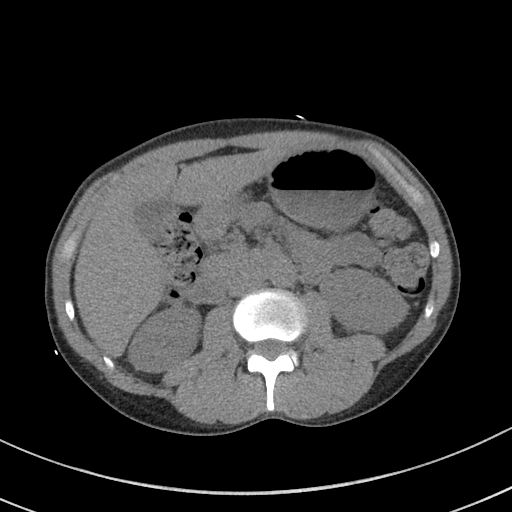
[im 61/84  soft-tissue]
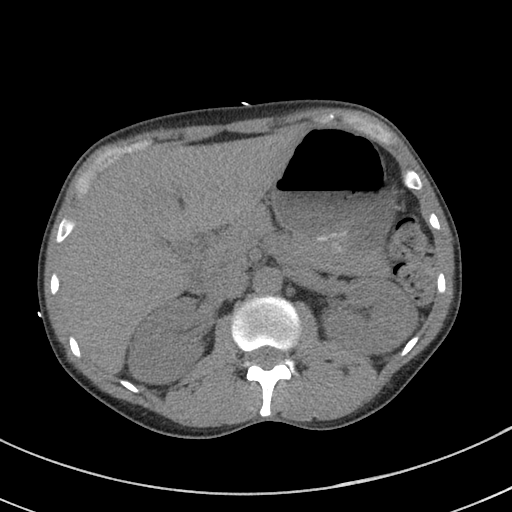
[im 68/84  soft-tissue]
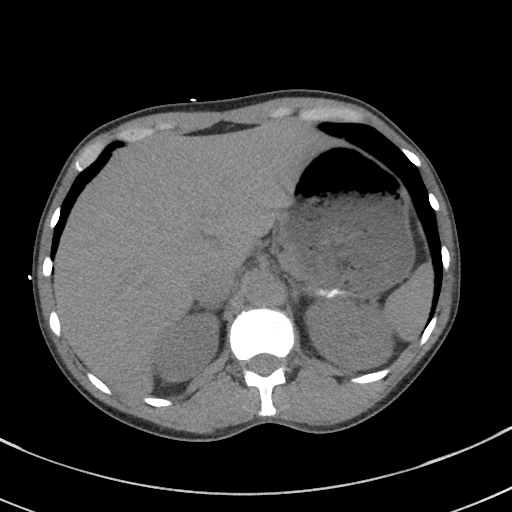
[im 74/84  soft-tissue]
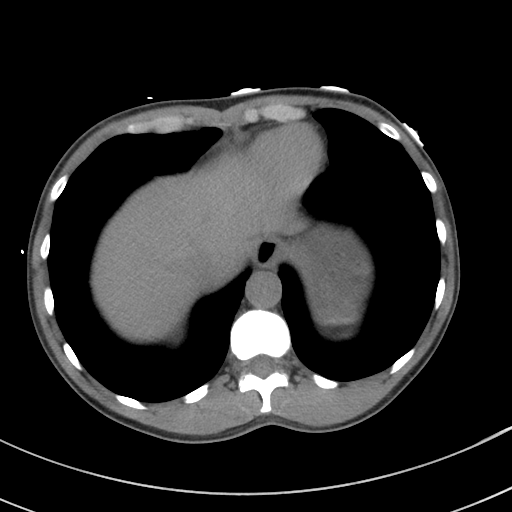
[im 80/84  soft-tissue]
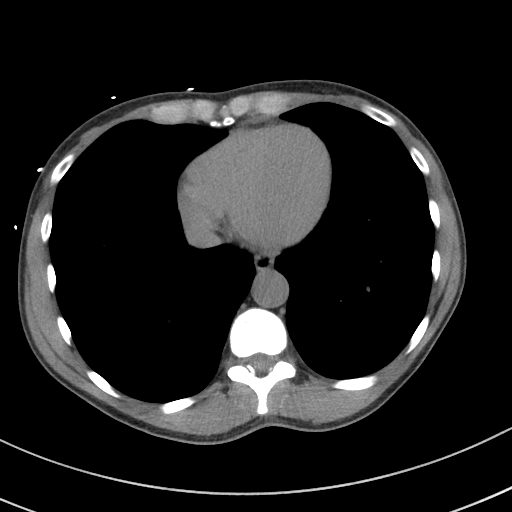

[Series 6: a/p w/o cor · coronal · non-contrast · 0.95mm/px · 3 of 151 slices shown]
[im 51/151  soft-tissue]
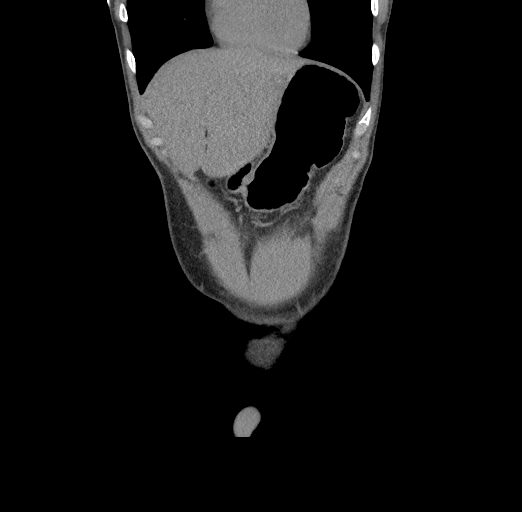
[im 67/151  soft-tissue]
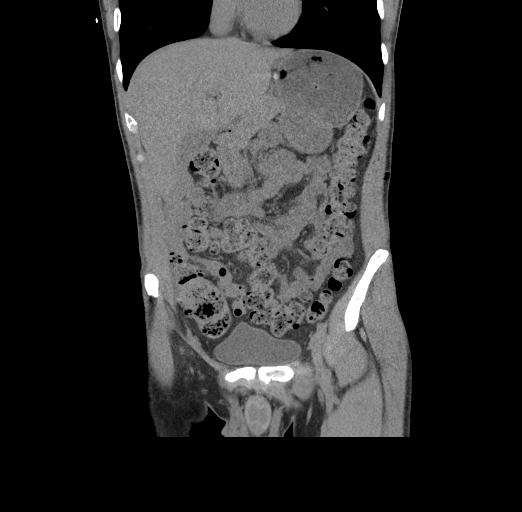
[im 84/151  soft-tissue]
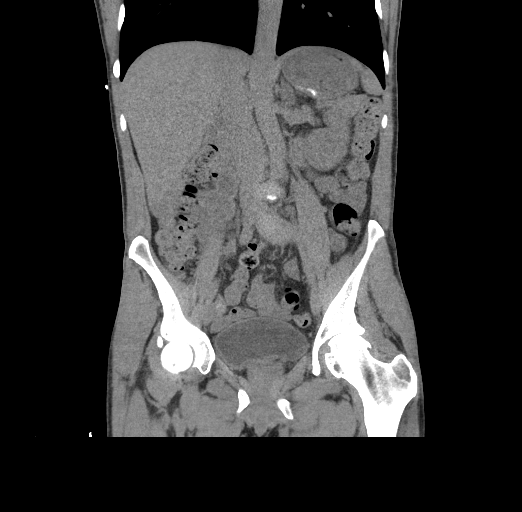

[17 of 46 positions shown; findings below may reference images not displayed]

FINDINGS: Lower chest: Solitary 3 mm pleural based nodule at the right lung
base on image 9 of series 4. Otherwise normal.

Hepatobiliary: No focal liver abnormality is seen. No gallstones,
gallbladder wall thickening, or biliary dilatation.

Pancreas: Unremarkable. No pancreatic ductal dilatation or
surrounding inflammatory changes.

Spleen: Normal in size without focal abnormality.

Adrenals/Urinary Tract: Adrenal glands are unremarkable. Kidneys are
normal, without renal calculi, focal lesion, or hydronephrosis.
Bladder is unremarkable.

Stomach/Bowel: Stomach is within normal limits. Appendix appears
normal. No evidence of bowel wall thickening, distention, or
inflammatory changes.

Vascular/Lymphatic: No significant vascular findings are present. No
enlarged abdominal or pelvic lymph nodes.

Reproductive: Prostate is unremarkable.

Other: No abdominal wall hernia or abnormality. No abdominopelvic
ascites.

Musculoskeletal: No acute abnormality. Moderate arthritis of the
left hip. Mild right hip arthritis. Osteophytes fuse the superior
aspect of the left sacroiliac joint.
IMPRESSION: Benign-appearing abdomen and pelvis. Tiny pleural based nodule at
the right lung base posteriorly. No follow-up needed if patient is
low-risk. Non-contrast chest CT can be considered in 12 months if
patient is high-risk. This recommendation follows the consensus
statement: Guidelines for Management of Incidental Pulmonary Nodules
Detected on CT Images: From the [HOSPITAL] 7925; Radiology

## 2021-01-22 ENCOUNTER — Other Ambulatory Visit: Payer: Self-pay | Admitting: Internal Medicine

## 2021-02-01 ENCOUNTER — Telehealth: Payer: Self-pay

## 2021-02-01 DIAGNOSIS — E0590 Thyrotoxicosis, unspecified without thyrotoxic crisis or storm: Secondary | ICD-10-CM

## 2021-02-01 MED ORDER — METHIMAZOLE 5 MG PO TABS: 5.0000 mg | ORAL_TABLET | ORAL | 1 refills | Status: DC

## 2021-02-01 NOTE — Telephone Encounter (Signed)
Inbound rx refill request

## 2021-02-11 ENCOUNTER — Other Ambulatory Visit: Payer: Self-pay

## 2021-02-11 ENCOUNTER — Ambulatory Visit (INDEPENDENT_AMBULATORY_CARE_PROVIDER_SITE_OTHER): Payer: PRIVATE HEALTH INSURANCE | Admitting: Endocrinology

## 2021-02-11 VITALS — BP 150/78 | HR 91 | Ht 68.0 in | Wt 118.0 lb

## 2021-02-11 DIAGNOSIS — E0590 Thyrotoxicosis, unspecified without thyrotoxic crisis or storm: Secondary | ICD-10-CM

## 2021-02-11 LAB — T4, FREE: Free T4: 0.56 ng/dL — ABNORMAL LOW (ref 0.60–1.60)

## 2021-02-11 LAB — TSH: TSH: 0.6 u[IU]/mL (ref 0.35–5.50)

## 2021-02-11 MED ORDER — METHIMAZOLE 5 MG PO TABS: 5.0000 mg | ORAL_TABLET | ORAL | 3 refills | Status: DC

## 2021-02-11 NOTE — Patient Instructions (Signed)
Blood tests are requested for you today.  We'll let you know about the results.  If ever you have fever while taking methimazole, stop it and call us, even if the reason is obvious, because of the risk of a rare side-effect. It is best to never miss the medication.  However, if you do miss it, next best is to double up the next time.   Please come back for a follow-up appointment in 6 months.   

## 2021-02-11 NOTE — Progress Notes (Signed)
Subjective:    Patient ID: Hunter Mitchell, male    DOB: [birth year], 56 y.o.   MRN: [id number]  HPI Pt returns for f/u of hyperthyroidism (dx'ed 2012; he has never been on therapy for this; he has never had thyroid imaging; tapazole was chosen as rx, due to mild degree of TSH abnormality). pt states he feels well in general.  He takes tapazole as rx'ed.   Past Medical History:  Diagnosis Date   Syncope 11-2009   ECHO 01-2010 normal    Past Surgical History:  Procedure Laterality Date   NO PAST SURGERIES      Social History   Socioeconomic History   Marital status: Married    Spouse name: Not on file   Number of children: 3   Years of education: Not on file   Highest education level: Not on file  Occupational History   Occupation: [employer]  Tobacco Use   Smoking status: Never   Smokeless tobacco: Never  Vaping Use   Vaping Use: Never used  Substance and Sexual Activity   Alcohol use: Yes    Comment: occ beer , weekends    Drug use: No   Sexual activity: Not on file  Other Topics Concern   Not on file  Social History Narrative   Household- pt , wife    Children x 3,biological, 4 step children      Social Determinants of Health   Financial Resource Strain: Not on file  Food Insecurity: Not on file  Transportation Needs: Not on file  Physical Activity: Not on file  Stress: Not on file  Social Connections: Not on file  Intimate Partner Violence: Not on file    Current Outpatient Medications on File Prior to Visit  Medication Sig Dispense Refill   amLODipine (NORVASC) 10 MG tablet Take 1 tablet (10 mg total) by mouth at bedtime. 90 tablet 2   Ascorbic Acid (VITAMIN C PO) Take 1 tablet by mouth daily.     b complex vitamins tablet Take 1 tablet by mouth daily.     cholecalciferol (VITAMIN D3) 25 MCG (1000 UNIT) tablet Take 2,000 Units by mouth daily.     Docosahexaenoic Acid (DHA PO) Take 1 tablet by mouth daily.     escitalopram (LEXAPRO) 10 MG tablet Take  1 tablet by mouth once daily 90 tablet 1   Multiple Vitamin (MULTIVITAMIN WITH MINERALS) TABS tablet Take 1 tablet by mouth daily.     Omega-3 Fatty Acids (FISH OIL PO) Take 1 capsule by mouth daily.     No current facility-administered medications on file prior to visit.    No Known Allergies  Family History  Problem Relation Age of Onset   Colon cancer Neg Hx    Breast cancer Neg Hx    Prostate cancer Neg Hx    Heart attack Neg Hx    Diabetes Neg Hx    Thyroid disease Neg Hx     BP (!) 150/78    Pulse 91    Ht 5\' 8"  (1.727 m)    Wt 118 lb (53.5 kg)    SpO2 99%    BMI 17.94 kg/m    Review of Systems Denies fever.     Objective:   Physical Exam VITAL SIGNS:  See vs page GENERAL: no distress NECK: thyroid is slightly and diffusely enlarged.     Lab Results  Component Value Date   TSH 0.60 02/11/2021      Assessment & Plan:  Hyperthyroidism: well-controlled.  Please continue the same methimazole

## 2021-05-14 ENCOUNTER — Encounter: Payer: Self-pay | Admitting: Internal Medicine

## 2021-05-14 ENCOUNTER — Ambulatory Visit (INDEPENDENT_AMBULATORY_CARE_PROVIDER_SITE_OTHER): Payer: PRIVATE HEALTH INSURANCE | Admitting: Internal Medicine

## 2021-05-14 VITALS — BP 120/70 | HR 86 | Temp 98.2°F | Resp 16 | Ht 68.0 in | Wt 116.4 lb

## 2021-05-14 DIAGNOSIS — Z125 Encounter for screening for malignant neoplasm of prostate: Secondary | ICD-10-CM

## 2021-05-14 DIAGNOSIS — Z1211 Encounter for screening for malignant neoplasm of colon: Secondary | ICD-10-CM

## 2021-05-14 DIAGNOSIS — Z0000 Encounter for general adult medical examination without abnormal findings: Secondary | ICD-10-CM | POA: Diagnosis not present

## 2021-05-14 DIAGNOSIS — R7989 Other specified abnormal findings of blood chemistry: Secondary | ICD-10-CM

## 2021-05-14 DIAGNOSIS — I10 Essential (primary) hypertension: Secondary | ICD-10-CM | POA: Diagnosis not present

## 2021-05-14 DIAGNOSIS — E0590 Thyrotoxicosis, unspecified without thyrotoxic crisis or storm: Secondary | ICD-10-CM | POA: Diagnosis not present

## 2021-05-14 DIAGNOSIS — Z0001 Encounter for general adult medical examination with abnormal findings: Secondary | ICD-10-CM

## 2021-05-14 DIAGNOSIS — F1010 Alcohol abuse, uncomplicated: Secondary | ICD-10-CM | POA: Diagnosis not present

## 2021-05-14 LAB — B12 AND FOLATE PANEL
Folate: 4.7 ng/mL — ABNORMAL LOW (ref >5.9)
Vitamin B-12: 570 pg/mL (ref 211–911)

## 2021-05-14 LAB — CBC WITH DIFFERENTIAL/PLATELET
Basophils Absolute: 0 10*3/uL (ref 0.0–0.1)
Basophils Relative: 1.6 % (ref 0.0–3.0)
Eosinophils Absolute: 0.1 10*3/uL (ref 0.0–0.7)
Eosinophils Relative: 3.6 % (ref 0.0–5.0)
HCT: 39.9 % (ref 39.0–52.0)
Hemoglobin: 13.1 g/dL (ref 13.0–17.0)
Lymphocytes Relative: 27.7 % (ref 12.0–46.0)
Lymphs Abs: 0.7 10*3/uL (ref 0.7–4.0)
MCHC: 32.9 g/dL (ref 30.0–36.0)
MCV: 91.5 fl (ref 78.0–100.0)
Monocytes Absolute: 0.3 10*3/uL (ref 0.1–1.0)
Monocytes Relative: 14.4 % — ABNORMAL HIGH (ref 3.0–12.0)
Neutro Abs: 1.2 10*3/uL — ABNORMAL LOW (ref 1.4–7.7)
Neutrophils Relative %: 52.7 % (ref 43.0–77.0)
Platelets: 187 10*3/uL (ref 150.0–400.0)
RBC: 4.36 Mil/uL (ref 4.22–5.81)
RDW: 14.6 % (ref 11.5–15.5)
WBC: 2.4 10*3/uL — ABNORMAL LOW (ref 4.0–10.5)

## 2021-05-14 LAB — COMPREHENSIVE METABOLIC PANEL
ALT: 41 U/L (ref 0–53)
AST: 70 U/L — ABNORMAL HIGH (ref 0–37)
Albumin: 4.6 g/dL (ref 3.5–5.2)
Alkaline Phosphatase: 61 U/L (ref 39–117)
BUN: 8 mg/dL (ref 6–23)
CO2: 28 mEq/L (ref 19–32)
Calcium: 9.3 mg/dL (ref 8.4–10.5)
Chloride: 99 mEq/L (ref 96–112)
Creatinine, Ser: 0.55 mg/dL (ref 0.40–1.50)
GFR: 112.52 mL/min (ref >60.00)
Glucose, Bld: 100 mg/dL — ABNORMAL HIGH (ref 70–99)
Potassium: 3.6 mEq/L (ref 3.5–5.1)
Sodium: 138 mEq/L (ref 135–145)
Total Bilirubin: 0.5 mg/dL (ref 0.2–1.2)
Total Protein: 7.6 g/dL (ref 6.0–8.3)

## 2021-05-14 LAB — LIPID PANEL
Cholesterol: 251 mg/dL — ABNORMAL HIGH (ref 0–200)
HDL: 181.5 mg/dL (ref >39.00)
LDL Cholesterol: 62 mg/dL (ref 0–99)
NonHDL: 69.77
Total CHOL/HDL Ratio: 1
Triglycerides: 39 mg/dL (ref 0.0–149.0)
VLDL: 7.8 mg/dL (ref 0.0–40.0)

## 2021-05-14 LAB — PSA: PSA: 1.71 ng/mL (ref 0.10–4.00)

## 2021-05-14 NOTE — Patient Instructions (Signed)
Check the  blood pressure regularly ?BP GOAL is between 110/65 and  135/85. ?If it is consistently higher or lower, let me know ? ? ?GO TO THE LAB : Get the blood work   ? ? ?[place], [place] ?Come back for a checkup in 4 months ? ? ?STOP BY THE FIRST FLOOR: Schedule liver ultrasound ? ? ? ?Alcohol Abuse and Dependence Information, Adult ?Alcohol is a widely available drug. People drink alcohol in different amounts. People who drink alcohol very often and in large amounts often have problems during and after drinking. They may develop what is called an alcohol use disorder. There are two main types of alcohol use disorders: ?Alcohol abuse. This is when you use alcohol too much or too often. You may use alcohol to make yourself feel happy or to reduce stress. You may have a hard time setting a limit on the amount you drink. ?Alcohol dependence. This is when you use alcohol consistently for a period of time, and your body changes as a result. This can make it hard to stop drinking because you may start to feel sick or feel different when you do not use alcohol. These symptoms are known as withdrawal. ?How can alcohol abuse and dependence affect me? ?Alcohol abuse and dependence can have a negative effect on your life. Drinking too much can lead to addiction. You may feel like you need alcohol to function normally. You may drink alcohol before work in the morning, during the day, or as soon as you get home from work in the evening. These actions can result in: ?Poor work Systems analyst. ?Job loss. ?Financial problems. ?Car crashes or criminal charges from driving after drinking alcohol. ?Problems in your relationships with friends and family. ?Losing the trust and respect of coworkers, friends, and family. ?Drinking heavily over a long period of time can permanently damage your body and brain, and can cause lifelong health issues, such as: ?Damage to your liver or pancreas. ?Heart  problems, high blood pressure, or stroke. ?Certain cancers. ?Decreased ability to fight infections. ?Brain or nerve damage. ?Depression. ?Early (premature) death. ?If you are careless or you crave alcohol, it is easy to drink more than your body can handle (overdose). Alcohol overdose is a serious situation that requires hospitalization. It may lead to permanent injuries or death. ?What can increase my risk? ?Having a family history of alcohol abuse. ?Having depression or other mental health conditions. ?Beginning to drink at an early age. ?Binge drinking often. ?Experiencing trauma, stress, and an unstable home life during childhood. ?Spending time with people who drink often. ?What actions can I take to prevent or manage alcohol abuse and dependence? ?Do not drink alcohol if: ?Your health care provider tells you not to drink. ?You are pregnant, may be pregnant, or are planning to become pregnant. ?If you drink alcohol: ?Limit how much you use to: ?0-1 drink a day for women. ?0-2 drinks a day for men. ?Be aware of how much alcohol is in your drink. In the [place] one drink equals one 12 oz bottle of beer (355 mL), one 5 oz glass of wine (148 mL), or one 1? oz glass of hard liquor (44 mL). ?Stop drinking if you have been drinking too much. This can be very hard to do if you are used to abusing alcohol. If you begin to have withdrawal symptoms, talk with your health care provider or a person that you trust. These symptoms may include anxiety,  shaky hands, headache, nausea, sweating, or not being able to sleep. ?Choose to drink nonalcoholic beverages in social gatherings and places where there may be alcohol. ?Activity ?Spend more time on activities that you enjoy that do not involve alcohol, like hobbies or exercise. ?Find healthy ways to cope with stress, such as exercise, meditation, or spending time with people you care about. ?General information ?Talk to your family, coworkers, and friends about supporting you in  your efforts to stop drinking. If they drink, ask them not to drink around you. Spend more time with people who do not drink alcohol. ?If you think that you have an alcohol dependency problem: ?Tell friends or family about your concerns. ?Talk with your health care provider or another health professional about where to get help. ?Work with a Transport planner and a Regulatory affairs officer. ?Consider joining a support group for people who struggle with alcohol abuse and dependence. ?Where to find support ? ?Your health care provider. ?SMART Recovery: www.smartrecovery.org ?Therapy and support groups ?Local treatment centers or chemical dependency counselors. ?Local AA groups in your community: NicTax.com.pt ?Where to find more information ?Centers for Disease Control and Prevention: http://www.wolf.info/ ?[employer] on Alcohol Abuse and Alcoholism: http://www.bradshaw.com/ ?Alcoholics Anonymous (AA): NicTax.com.pt ?Contact a health care provider if: ?You drank more or for longer than you intended on more than one occasion. ?You tried to stop drinking or to cut back on how much you drink, but you were not able to. ?You often drink to the point of vomiting or passing out. ?You want to drink so badly that you cannot think about anything else. ?You have problems in your life due to drinking, but you continue to drink. ?You keep drinking even though you feel anxious, depressed, or have experienced memory loss. ?You have stopped doing the things you used to enjoy in order to drink. ?You have to drink more than you used to in order to get the effect you want. ?You experience anxiety, sweating, nausea, shakiness, and trouble sleeping when you try to stop drinking. ?Get help right away if: ?You have thoughts about hurting yourself or others. ?You have serious withdrawal symptoms, including: ?Confusion. ?Racing heart. ?High blood pressure. ?Fever. ?If you ever feel like you may hurt yourself or others, or have thoughts about taking your own  life, get help right away. You can go to your nearest emergency department or call: ?Your local emergency services (911 in the [place] ?A suicide crisis helpline, such as the [facility] at 919-547-3444 or 988 in the [place]. This is open 24 hours a day. ?Summary ?Alcohol abuse and dependence can have a negative effect on your life. Drinking too much or too often can lead to addiction. ?If you drink alcohol, limit how much you use. ?If you are having trouble keeping your drinking under control, find ways to change your behavior. Hobbies, calming activities, exercise, or support groups can help. ?If you feel you need help with changing your drinking habits, talk with your health care provider, a good friend, or a therapist, or go to an [place] group. ?This information is not intended to replace advice given to you by your health care provider. Make sure you discuss any questions you have with your health care provider. ?Document Revised: 11/27/2020 Document Reviewed: 03/13/2018 ?Elsevier Patient Education ? [place]. ? ?  ?

## 2021-05-14 NOTE — Progress Notes (Signed)
? ?Subjective:  ? ? Patient ID: Hunter Mitchell, male    DOB: [birth year], 56 y.o.   MRN: [id number] ? ?DOS:  05/14/2021 ?Type of visit - description: cpx ? ?Since the last office visit feels well. ?Saw endocrinology, notes reviewed ?We talk about increased LFTs, he continue drinking etoh daily. ?Did notice that if skips alcohol,  has tremors. ?Occasionally takes Tylenol but less than once weekly. ?  ?Wt Readings from Last 3 Encounters:  ?05/14/21 116 lb 6 oz (52.8 kg)  ?02/11/21 118 lb (53.5 kg)  ?08/11/20 117 lb (53.1 kg)  ? ? ?Review of Systems ? ?Other than above, a 14 point review of systems is negative  ?  ? ?Past Medical History:  ?Diagnosis Date  ? Syncope 11-2009  ? ECHO 01-2010 normal  ? ? ?Past Surgical History:  ?Procedure Laterality Date  ? NO PAST SURGERIES    ? ?Social History  ? ?Socioeconomic History  ? Marital status: Married  ?  Spouse name: Not on file  ? Number of children: 3  ? Years of education: Not on file  ? Highest education level: Not on file  ?Occupational History  ? Occupation: [employer]  ?Tobacco Use  ? Smoking status: Never  ? Smokeless tobacco: Never  ?Vaping Use  ? Vaping Use: Never used  ?Substance and Sexual Activity  ? Alcohol use: Yes  ?  Comment: "two" beers qhs , weekends "3 or 4".  ? Drug use: No  ? Sexual activity: Not on file  ?Other Topics Concern  ? Not on file  ?Social History Narrative  ? Household- pt , wife   ? Children x 3,biological, 4 step children  ?   ? ?Social Determinants of Health  ? ?Financial Resource Strain: Not on file  ?Food Insecurity: Not on file  ?Transportation Needs: Not on file  ?Physical Activity: Not on file  ?Stress: Not on file  ?Social Connections: Not on file  ?Intimate Partner Violence: Not on file  ? ? ? ?Current Outpatient Medications  ?Medication Instructions  ? amLODipine (NORVASC) 10 mg, Oral, Daily at bedtime  ? Ascorbic Acid (VITAMIN C PO) 1 tablet, Oral, Daily  ? b complex vitamins tablet 1 tablet, Oral, Daily  ? cholecalciferol  (VITAMIN D3) 2,000 Units, Oral, Daily  ? Docosahexaenoic Acid (DHA PO) 1 tablet, Oral, Daily  ? escitalopram (LEXAPRO) 10 MG tablet Take 1 tablet by mouth once daily  ? methimazole (TAPAZOLE) 5 mg, Oral, 3 times weekly  ? Multiple Vitamin (MULTIVITAMIN WITH MINERALS) TABS tablet 1 tablet, Oral, Daily  ? Omega-3 Fatty Acids (FISH OIL PO) 1 capsule, Oral, Daily  ? ? ?   ?Objective:  ? Physical Exam ?BP 120/70 (BP Location: Left Arm, Patient Position: Sitting, Cuff Size: Small)   Pulse 86   Temp 98.2 ?F (36.8 ?C) (Oral)   Resp 16   Ht 5\' 8"  (1.727 m)   Wt 116 lb 6 oz (52.8 kg)   SpO2 98%   BMI 17.69 kg/m?  ?General: ?Well developed, NAD, BMI noted ?Neck: No  thyromegaly  ?HEENT:  ?Normocephalic . Face symmetric, atraumatic ?Lungs:  ?CTA B ?Normal respiratory effort, no intercostal retractions, no accessory muscle use. ?Heart: RRR,  no murmur.  ?Abdomen:  ?Not distended, soft, non-tender. No rebound or rigidity.   ?Lower extremities: no pretibial edema bilaterally  ?Skin: Exposed areas without rash. Not pale. Not jaundice ?Neurologic:  ?alert & oriented X3.  ?Speech normal, gait appropriate for age and unassisted ?Strength symmetric and appropriate  for age.  ?Psych: ?Cognition and judgment appear intact.  ?Cooperative with normal attention span and concentration.  ?Behavior appropriate. ?No anxious or depressed appearing. ? ?   ?Assessment   ? ?ASSESSMENT ?Hypertensive emergency 06/2018 ?Hyperthyroidism, per endo ?Anxiety (started lexapro 06/2018) ?CV: ?--Syncope, normal echo 2012 ?--Syncope 06/2018, work-up negative ?Alcohol abuse DX 06/2018 ?LIVER: ? Hep B: Labs on 7-20 20: HBsAb+, HBsAg- consistent with immunity. The HBV viral load (DNA) was undetectable ?Vitamin D deficiency DX 07-2018 ? ?PLAN: ?Here for CPX ?HTN: On amlodipine, seems well controlled.  No change.Monitor BPs. ?Hyperthyroidism: Saw Endo 01-2021, free T4 low, TSH normal.  Due for a checkup 07-2021. ?Takes Tapazole, that may be the culprit of increased  LFTs and low WBCs. ?Anxiety: Controlled on Lexapro ?Alcohol abuse: Drinks daily, reports tremors when skips etoh. Advised patient he has a alcohol problem, recommend to look for help (AA).  Check thiamine, 123456 and folic acid. ?Increased LFTs: See above, recheck LFTs, d/t etod or tapazole vs others; check abdominal ultrasound.  Also transferrin saturation, alpha-1 antitrypsin. ?Consider ask Endo to change  Tapazole. ?RTC 4 months ? ?In addition to CPX we discussed all chronic med problems requiring chart review and counseling ?  ?

## 2021-05-15 ENCOUNTER — Encounter: Payer: Self-pay | Admitting: Internal Medicine

## 2021-05-15 NOTE — Assessment & Plan Note (Signed)
?-  Td  03/30/2016 ?- Shingrix &b Covid vaccination: declines ?--CCS: iFOB neg 2015 and 2018,   GI referral failed 2022, request cologuard, will do; rec to check coverage  ?--DRE normal 2022, re check PSA ?--Labs:CMP, FLP, CBC, PSA, B12, folic acid, thiamine, transferrin saturation, alpha-1 antitrypsin ?-Diet and exercise discussed ?- ACP package of info provided ?

## 2021-05-15 NOTE — Assessment & Plan Note (Signed)
Here for CPX ?HTN: On amlodipine, seems well controlled.  No change.Monitor BPs. ?Hyperthyroidism: Saw Endo 01-2021, free T4 low, TSH normal.  Due for a checkup 07-2021. ?Takes Tapazole, that may be the culprit of increased LFTs and low WBCs. ?Anxiety: Controlled on Lexapro ?Alcohol abuse: Drinks daily, reports tremors when skips etoh. Advised patient he has a alcohol problem, recommend to look for help (AA).  Check thiamine, B12 and folic acid. ?Increased LFTs: See above, recheck LFTs, d/t etod or tapazole vs others; check abdominal ultrasound.  Also transferrin saturation, alpha-1 antitrypsin. ?Consider ask Endo to change  Tapazole. ?RTC 4 months ?

## 2021-05-17 LAB — ALPHA-1-ANTITRYPSIN: A-1 Antitrypsin, Ser: 102 mg/dL (ref 83–199)

## 2021-05-18 ENCOUNTER — Telehealth: Payer: Self-pay | Admitting: Endocrinology

## 2021-05-18 NOTE — Telephone Encounter (Signed)
*  Regarding discontinuation of Methimazole ?

## 2021-05-18 NOTE — Telephone Encounter (Signed)
please contact patient: ?D/c methimazole ?F/u OV 05/20/21, 4:30PM ?

## 2021-05-18 NOTE — Telephone Encounter (Signed)
Attempted to contact the patient regarding lab results, LVM and will send my chart message ? ?

## 2021-05-19 LAB — TRANSFERRIN SATURATION
IRON SATN MFR SERPL: 32 % Saturation
IRON SERPL-MCNC: 136 ug/dL
TRANSFERRIN SERPL-MCNC: 303 mg/dL

## 2021-05-20 LAB — VITAMIN B1: Vitamin B1 (Thiamine): <6 nmol/L — ABNORMAL LOW (ref 8–30)

## 2021-05-21 MED ORDER — THIAMINE MONONITRATE 100 MG PO TABS: 100.0000 mg | ORAL_TABLET | Freq: Every day | ORAL | 3 refills | Status: DC

## 2021-05-21 MED ORDER — FOLIC ACID 5 MG PO CAPS: 5.0000 mg | ORAL_CAPSULE | Freq: Every day | ORAL | 3 refills | Status: DC

## 2021-05-21 NOTE — Addendum Note (Signed)
Addended byConrad New Deal D on: 05/21/2021 04:32 PM ? ? Modules accepted: Orders ? ?

## 2021-05-24 ENCOUNTER — Telehealth: Payer: Self-pay

## 2021-05-24 MED ORDER — FOLIC ACID 1 MG PO TABS: 2.0000 mg | ORAL_TABLET | Freq: Every day | ORAL | 3 refills | Status: DC

## 2021-05-24 NOTE — Telephone Encounter (Signed)
Rx sent 

## 2021-05-24 NOTE — Telephone Encounter (Signed)
[facility] does not have folic acid 5mg  tablets, they do have 1mg  tablets, okay to change to 1mg ?  ?

## 2021-05-24 NOTE — Telephone Encounter (Signed)
Okay to change to folic acid 1 mg: 2 tablets daily ?

## 2021-06-03 ENCOUNTER — Ambulatory Visit: Payer: PRIVATE HEALTH INSURANCE | Admitting: Endocrinology

## 2021-06-04 ENCOUNTER — Ambulatory Visit (HOSPITAL_BASED_OUTPATIENT_CLINIC_OR_DEPARTMENT_OTHER)
Admission: RE | Admit: 2021-06-04 | Discharge: 2021-06-04 | Disposition: A | Discharge status: Home / Self Care | Payer: 59 | Source: Ambulatory Visit | Attending: Internal Medicine | Admitting: Internal Medicine

## 2021-06-04 DIAGNOSIS — R7989 Other specified abnormal findings of blood chemistry: Secondary | ICD-10-CM | POA: Diagnosis present

## 2021-06-04 DIAGNOSIS — F1010 Alcohol abuse, uncomplicated: Secondary | ICD-10-CM | POA: Diagnosis present

## 2021-06-18 LAB — COLOGUARD

## 2021-06-25 ENCOUNTER — Telehealth: Payer: Self-pay | Admitting: Internal Medicine

## 2021-06-25 MED ORDER — ESCITALOPRAM OXALATE 10 MG PO TABS: 10.0000 mg | ORAL_TABLET | Freq: Every day | ORAL | 1 refills | Status: DC

## 2021-06-25 NOTE — Telephone Encounter (Signed)
Rx sent 

## 2021-06-25 NOTE — Telephone Encounter (Signed)
Pt's wife stated pharmacy told them he did not have any refills.   Medication:  escitalopram (LEXAPRO) 10 MG tablet  Has the patient contacted their pharmacy? Yes.    Preferred Pharmacy: [facility] [address] [place], [place] - [address]   [address] [place], [place] [address]  Phone:  [phone]  Fax:  [phone]

## 2021-06-29 ENCOUNTER — Encounter: Payer: Self-pay | Admitting: Internal Medicine

## 2021-07-14 ENCOUNTER — Other Ambulatory Visit: Payer: Self-pay

## 2021-07-14 DIAGNOSIS — Z1211 Encounter for screening for malignant neoplasm of colon: Secondary | ICD-10-CM

## 2021-08-11 LAB — COLOGUARD: COLOGUARD: NEGATIVE

## 2021-09-07 ENCOUNTER — Ambulatory Visit: Payer: PRIVATE HEALTH INSURANCE | Admitting: Internal Medicine

## 2021-09-27 ENCOUNTER — Encounter: Payer: Self-pay | Admitting: Internal Medicine

## 2021-09-27 ENCOUNTER — Ambulatory Visit: Payer: PRIVATE HEALTH INSURANCE | Admitting: Internal Medicine

## 2021-11-19 ENCOUNTER — Encounter: Payer: Self-pay | Admitting: Internal Medicine

## 2021-11-19 ENCOUNTER — Ambulatory Visit (INDEPENDENT_AMBULATORY_CARE_PROVIDER_SITE_OTHER): Payer: PRIVATE HEALTH INSURANCE | Admitting: Internal Medicine

## 2021-11-19 VITALS — BP 140/82 | HR 117 | Ht 68.0 in | Wt 111.0 lb

## 2021-11-19 DIAGNOSIS — E0590 Thyrotoxicosis, unspecified without thyrotoxic crisis or storm: Secondary | ICD-10-CM | POA: Diagnosis not present

## 2021-11-19 DIAGNOSIS — F1010 Alcohol abuse, uncomplicated: Secondary | ICD-10-CM | POA: Diagnosis not present

## 2021-11-19 LAB — T3, FREE: T3, Free: 3.7 pg/mL (ref 2.3–4.2)

## 2021-11-19 LAB — TSH: TSH: 0.84 u[IU]/mL (ref 0.35–5.50)

## 2021-11-19 LAB — T4, FREE: Free T4: 0.58 ng/dL — ABNORMAL LOW (ref 0.60–1.60)

## 2021-11-19 NOTE — Progress Notes (Unsigned)
Patient ID: Hunter LLERENAS, male   DOB: 1965/07/13, 56 y.o.   MRN: [id number]  HPI  Hunter Mitchell is a 56 y.o.-year-old male, returning for follow-up for thyrotoxicosis.  He previously saw Dr. Everardo All, last visit 9 months ago.  He was diagnosed with thyrotoxicosis in 2012.  He did not have imaging for this.  It was assumed that he had Graves' disease and he was started on methimazole.  He is currently on methimazole 5 mg 3 times a week.  He tolerates this well.  I reviewed pt's thyroid tests: Lab Results  Component Value Date   TSH 0.60 02/11/2021   TSH 0.40 08/11/2020   TSH 0.73 02/11/2020   TSH 1.11 10/11/2019   TSH 1.18 07/10/2019   TSH 0.32 (L) 03/11/2019   TSH 0.30 (L) 12/10/2018   TSH 1.136 07/11/2018   TSH 0.35 07/04/2018   TSH 0.34 (L) 06/21/2017   FREET4 0.56 (L) 02/11/2021   FREET4 0.59 (L) 08/11/2020   FREET4 0.66 02/11/2020   FREET4 0.65 10/11/2019   FREET4 0.61 07/10/2019   FREET4 0.38 (L) 03/11/2019   FREET4 0.62 12/10/2018   FREET4 0.70 07/04/2018   FREET4 0.64 06/21/2017   FREET4 0.65 10/19/2016   T3FREE 3.1 10/14/2014   Antithyroid antibodies: No results found for: "TSI"  Of note, he has a history of transaminitis (previous AST has been Mitchell high Mitchell 764): Lab Results  Component Value Date   ALT 41 05/14/2021   AST 70 (H) 05/14/2021   ALKPHOS 61 05/14/2021   BILITOT 0.5 05/14/2021    He also has a history of undetectable B1 vitamin.  He is taking a B1 supplement.  Upon question then, he has a history of alcohol abuse.  Now fine to cut down.  Pt denies: - feeling nodules in neck - hoarseness - dysphagia - choking  He denies: - fatigue - excessive sweating/heat intolerance - palpitations - hyperdefecation - weight loss (weight is fluctuations) - hair loss  He has: - tremors - anxiety - on medications  Pt does not have a FH of thyroid ds. No FH of thyroid cancer. No h/o radiation tx to head or neck. No seaweed or kelp, no recent  contrast studies. No steroid use. No herbal supplements. + Biotin use - B complex - last dose 2 mo ago.  Pt. also has a history of HTN, history of syncope.  ROS:  + see HPI  Past Medical History:  Diagnosis Date   Syncope 11-2009   ECHO 01-2010 normal   Past Surgical History:  Procedure Laterality Date   NO PAST SURGERIES     Social History   Socioeconomic History   Marital status: Married    Spouse name: Not on file   Number of children: 3   Years of education: Not on file   Highest education level: Not on file  Occupational History   Occupation: [employer]  Tobacco Use   Smoking status: Never   Smokeless tobacco: Never  Vaping Use   Vaping Use: Never used  Substance and Sexual Activity   Alcohol use: Yes    Comment: "two" beers qhs , weekends "3 or 4".   Drug use: No   Sexual activity: Not on file  Other Topics Concern   Not on file  Social History Narrative   Household- pt , wife    Children x 3,biological, 4 step children      Social Determinants of Health   Financial Resource Strain: Not on file  Food Insecurity: Not on file  Transportation Needs: Not on file  Physical Activity: Not on file  Stress: Not on file  Social Connections: Not on file  Intimate Partner Violence: Not on file   Current Outpatient Medications on File Prior to Visit  Medication Sig Dispense Refill   amLODipine (NORVASC) 10 MG tablet Take 1 tablet (10 mg total) by mouth at bedtime. 90 tablet 2   Ascorbic Acid (VITAMIN C PO) Take 1 tablet by mouth daily.     b complex vitamins tablet Take 1 tablet by mouth daily.     cholecalciferol (VITAMIN D3) 25 MCG (1000 UNIT) tablet Take 2,000 Units by mouth daily.     Docosahexaenoic Acid (DHA PO) Take 1 tablet by mouth daily.     escitalopram (LEXAPRO) 10 MG tablet Take 1 tablet (10 mg total) by mouth daily. 90 tablet 1   folic acid (FOLVITE) 1 MG tablet Take 2 tablets (2 mg total) by mouth daily. 180 tablet 3   methimazole (TAPAZOLE) 5 MG  tablet Take 1 tablet (5 mg total) by mouth 3 (three) times a week. 45 tablet 3   Multiple Vitamin (MULTIVITAMIN WITH MINERALS) TABS tablet Take 1 tablet by mouth daily.     Omega-3 Fatty Acids (FISH OIL PO) Take 1 capsule by mouth daily.     thiamine (VITAMIN B-1) 100 MG tablet Take 1 tablet (100 mg total) by mouth daily. 90 tablet 3   No current facility-administered medications on file prior to visit.   No Known Allergies Family History  Problem Relation Age of Onset   Cancer Father        ?liver   Colon cancer Neg Hx    Breast cancer Neg Hx    Prostate cancer Neg Hx    Heart attack Neg Hx    Diabetes Neg Hx    Thyroid disease Neg Hx    PE: BP (!) 140/82 (BP Location: Right Arm, Patient Position: Sitting, Cuff Size: Normal)   Pulse (!) 117   Ht 5\' 8"  (1.727 m)   Wt 111 lb (50.3 kg)   SpO2 96%   BMI 16.88 kg/m  Wt Readings from Last 3 Encounters:  11/19/21 111 lb (50.3 kg)  05/14/21 116 lb 6 oz (52.8 kg)  02/11/21 118 lb (53.5 kg)   Constitutional: underweight, in NAD Eyes: no exophthalmos, no lid lag, no stare ENT: no thyromegaly, no thyroid bruits, no cervical lymphadenopathy Cardiovascular: RRR, No MRG Respiratory: CTA B Musculoskeletal: no deformities Skin: moist, warm, no rashes Neurological: + tremor with outstretched hands, B patellar DTR +3/4  ASSESSMENT: 1. Thyrotoxicosis  2.  Alcohol abuse  PLAN:  1. Patient with a h/o thyrotoxicosis, presumed due to Graves' disease, now controlled based on the last set of labs from last visit with Dr. Loanne Drilling.  He is on a low-dose of methimazole. - he does not appear to have exogenous causes for the low TSH.  - We discussed that possible causes of thyrotoxicosis are:  Graves ds   Thyroiditis toxic multinodular goiter/ toxic adenoma (I cannot feel nodules at palpation of his thyroid). - He did not have thyroid imaging and I cannot find Graves' antibody titer check in his chart to confirm Graves' disease.   - However,  it was inferred that he had Graves' disease and he has been on methimazole for many years.  He is on a low-dose, 5 mg 3 times a week, without side effects. - of note, he does have a history of  transaminitis, with a high AST, but latest level was only slightly above target. - at today's visit, he is tachycardic, at 117.  Also, he lost 7 pounds since last visit with Dr. Loanne Drilling. - will check the TSH, fT3 and fT4 and also add thyroid stimulating antibodies to screen for Graves' disease.  - If the tests are abnormal, we may need an uptake and scan to differentiate between the 3 above possible etiologies  - we discussed about possible modalities of treatment for the above conditions, to include methimazole use, radioactive iodine ablation or (last resort) surgery.  If the tests are normal today on methimazole, we can continue on this medication, and I would be prone to suggest to stop methimazole and just observe his TFTs, depending on the TSH - I did not suggest to add  beta blockers at this time, despite his tachycardia, since I am not sure whether this is related to his thyrotoxicosis or not - no signs of Graves' ophthalmopathy: no double vision, blurry vision, eye pain, chemosis. - RTC in 6 months, but likely sooner for repeat labs  2.  Alcohol abuse -He is trying to cut down alcohol.  Currently only drinking 2 hard liquor cans a day, previously "much more" -Discussed about the need to quit completely - he agrees and he is trying  Philemon Kingdom, MD PhD [facility]

## 2021-11-19 NOTE — Patient Instructions (Addendum)
Please continue Methimazole 5 mg 3x a week.  Please stop at the lab.  You should have an endocrinology follow-up appointment in 6 months.  Hyperthyroidism Hyperthyroidism refers to a thyroid gland that is too active or overactive. The thyroid gland is a small gland located in the lower front part of the neck, just in front of the windpipe (trachea). This gland makes hormones that: Help control how the body uses food for energy (metabolism). Help the heart and brain work well. Keep your bones strong. When the thyroid is overactive, it produces too much of a hormone called thyroxine. What are the causes? This condition may be caused by: Graves' disease. This is a disorder in which the body's disease-fighting system (immune system) attacks the thyroid gland. This is the most common cause. Inflammation of the thyroid gland. A tumor in the thyroid gland. Use of certain medicines, including: Prescription thyroid hormone replacement. Herbal supplements that mimic thyroid hormones. Amiodarone therapy. Solid or fluid-filled lumps within your thyroid gland (thyroid nodules). Taking in a large amount of iodine from foods or medicines. What increases the risk? You are more likely to develop this condition if: You are male. You have a family history of thyroid conditions. You smoke tobacco. You use a medicine called lithium. You take medicines that affect the immune system (immunosuppressants). What are the signs or symptoms? Symptoms of this condition include: Nervousness. Inability to tolerate heat. Diarrhea. Rapid heart rate. Shaky hands. Restlessness. Sleep problems. Other symptoms may include: Heart skipping beats or making extra beats. Unexplained weight loss. Change in the texture of hair or skin. Loss of menstruation. Fatigue. Enlarged thyroid gland or a lump in the thyroid (nodule). You may also have symptoms of Graves' disease, which may include: Protruding eyes. Dry  eyes. Red or swollen eyes. Problems with vision. How is this diagnosed? This condition may be diagnosed based on: Your symptoms and medical history. A physical exam. Blood tests. Thyroid ultrasound. This test involves using sound waves to produce images of the thyroid gland. A thyroid scan. A radioactive substance is injected into a vein, and images show how much iodine is present in the thyroid. Radioactive iodine uptake test (RAIU). A small amount of radioactive iodine is given by mouth to see how much iodine the thyroid absorbs after a certain amount of time. How is this treated? Treatment depends on the cause and severity of the condition. Treatment may include: Medicines to reduce the amount of thyroid hormone your body makes. Medicines to help manage your symptoms. Radioactive iodine treatment (radioiodine therapy). This involves swallowing a small dose of radioactive iodine, in capsule or liquid form, to kill thyroid cells. Surgery to remove part or all of your thyroid gland. You may need to take thyroid hormone replacement medicine for the rest of your life after thyroid surgery. Follow these instructions at home:  Take over-the-counter and prescription medicines only as told by your health care provider. Do not use any products that contain nicotine or tobacco. These products include cigarettes, chewing tobacco, and vaping devices, such as e-cigarettes. If you need help quitting, ask your health care provider. Follow any instructions from your health care provider about diet. You may be instructed to limit foods that contain iodine. Keep all follow-up visits. You will need to have blood tests regularly so that your health care provider can monitor your condition. Where to find more information [employer] of Diabetes and Digestive and Kidney Diseases: [employer] Contact a health care provider if: Your symptoms do  not get better with treatment. You have a fever. You have  abdominal pain. You feel dizzy. You are taking thyroid hormone replacement medicine and: You have symptoms of depression. You feel like you are tired all the time. You gain weight. Get help right away if: You have sudden, unexplained confusion or other mental changes. You have chest pain. You have fast or irregular heartbeats (palpitations). You have difficulty breathing. These symptoms may be an emergency. Get help right away. Call 911. Do not wait to see if the symptoms will go away. Do not drive yourself to the hospital. Summary The thyroid gland is a small gland located in the lower front part of the neck, just in front of the windpipe. Hyperthyroidism is when the thyroid gland is too active and produces too much of a hormone called thyroxine. The most common cause is Graves' disease, a disorder in which your immune system attacks the thyroid gland. Hyperthyroidism can cause various symptoms, such as unexplained weight loss, nervousness, inability to tolerate heat, or changes in your heartbeat. Treatment may include medicine to reduce the amount of thyroid hormone your body makes, radioiodine therapy, surgery, or medicines to manage symptoms. This information is not intended to replace advice given to you by your health care provider. Make sure you discuss any questions you have with your health care provider. Document Revised: 02/26/2021 Document Reviewed: 02/26/2021 Elsevier Patient Education  2023 [employer].

## 2021-11-23 LAB — THYROID STIMULATING IMMUNOGLOBULIN: TSI: <89 % baseline (ref <140)

## 2021-12-15 ENCOUNTER — Ambulatory Visit
Admission: EM | Admit: 2021-12-15 | Discharge: 2021-12-15 | Disposition: A | Discharge status: Home / Self Care | Payer: 59 | Attending: Urgent Care | Admitting: Urgent Care

## 2021-12-15 DIAGNOSIS — R531 Weakness: Secondary | ICD-10-CM | POA: Diagnosis not present

## 2021-12-15 DIAGNOSIS — E0590 Thyrotoxicosis, unspecified without thyrotoxic crisis or storm: Secondary | ICD-10-CM

## 2021-12-15 DIAGNOSIS — R5383 Other fatigue: Secondary | ICD-10-CM | POA: Diagnosis not present

## 2021-12-15 DIAGNOSIS — I959 Hypotension, unspecified: Secondary | ICD-10-CM

## 2021-12-15 DIAGNOSIS — F10930 Alcohol use, unspecified with withdrawal, uncomplicated: Secondary | ICD-10-CM | POA: Diagnosis not present

## 2021-12-15 DIAGNOSIS — F1010 Alcohol abuse, uncomplicated: Secondary | ICD-10-CM

## 2021-12-15 HISTORY — DX: Disorder of thyroid, unspecified: E07.9

## 2021-12-15 HISTORY — DX: Essential (primary) hypertension: I10

## 2021-12-15 HISTORY — DX: Anxiety disorder, unspecified: F41.9

## 2021-12-15 MED ORDER — ONDANSETRON HCL 4 MG/2ML IJ SOLN
4.0000 mg | Freq: Once | INTRAMUSCULAR | Status: AC
  Administered 2021-12-15: 4 mg via INTRAVENOUS

## 2021-12-15 MED ORDER — SODIUM CHLORIDE 0.9 % IV BOLUS
1000.0000 mL | Freq: Once | INTRAVENOUS | Status: AC
  Administered 2021-12-15: 1000 mL via INTRAVENOUS

## 2021-12-15 MED ORDER — ONDANSETRON 8 MG PO TBDP: 8.0000 mg | ORAL_TABLET | Freq: Three times a day (TID) | ORAL | 0 refills | Status: DC | PRN

## 2021-12-15 MED ORDER — THIAMINE MONONITRATE 100 MG PO TABS: 100.0000 mg | ORAL_TABLET | Freq: Every day | ORAL | 0 refills | Status: DC

## 2021-12-15 MED ORDER — CHLORDIAZEPOXIDE HCL 25 MG PO CAPS: 25.0000 mg | ORAL_CAPSULE | Freq: Two times a day (BID) | ORAL | 0 refills | Status: DC

## 2021-12-15 NOTE — ED Provider Notes (Signed)
[facility]  Note:  This document was prepared using Conservation officer, historic buildings and may include unintentional dictation errors.  MRN: [id number] DOB: October 23, 1965  Subjective:   Hunter Mitchell is a 56 y.o. male presenting for 3 day history of acute onset persistent fatigue, decreased appetite.  Patient had several episodes of vomiting on Monday.  Since then he has been trying to drink fluids and eat light meals like soup. He does recall that had beers, shots of liquor on Sunday the day before his symptoms started.  He has been drinking consistently and daily since Thanksgiving. Has a history of fatigue, alcohol on his problem list.  Denies drinking daily long term. Has a history of hyperthyroidism and just had his labs rechecked earlier this month.  The advice was to hold the methimazole and recheck in December. He stopped this about a week ago.   No current facility-administered medications for this encounter.  Current Outpatient Medications:    amLODipine (NORVASC) 10 MG tablet, Take 1 tablet (10 mg total) by mouth at bedtime., Disp: 90 tablet, Rfl: 2   Ascorbic Acid (VITAMIN C PO), Take 1 tablet by mouth daily., Disp: , Rfl:    b complex vitamins tablet, Take 1 tablet by mouth daily., Disp: , Rfl:    cholecalciferol (VITAMIN D3) 25 MCG (1000 UNIT) tablet, Take 2,000 Units by mouth daily., Disp: , Rfl:    Docosahexaenoic Acid (DHA PO), Take 1 tablet by mouth daily., Disp: , Rfl:    escitalopram (LEXAPRO) 10 MG tablet, Take 1 tablet (10 mg total) by mouth daily., Disp: 90 tablet, Rfl: 1   folic acid (FOLVITE) 1 MG tablet, Take 2 tablets (2 mg total) by mouth daily., Disp: 180 tablet, Rfl: 3   Multiple Vitamin (MULTIVITAMIN WITH MINERALS) TABS tablet, Take 1 tablet by mouth daily., Disp: , Rfl:    Omega-3 Fatty Acids (FISH OIL PO), Take 1 capsule by mouth daily., Disp: , Rfl:    thiamine (VITAMIN B-1) 100 MG tablet, Take 1 tablet (100 mg total) by mouth  daily., Disp: 90 tablet, Rfl: 3   No Known Allergies  Past Medical History:  Diagnosis Date   Anxiety    Hypertension    Syncope 11/17/2009   ECHO 01-2010 normal   Thyroid disease      Past Surgical History:  Procedure Laterality Date   NO PAST SURGERIES      Family History  Problem Relation Age of Onset   Cancer Father        ?liver   Colon cancer Neg Hx    Breast cancer Neg Hx    Prostate cancer Neg Hx    Heart attack Neg Hx    Diabetes Neg Hx    Thyroid disease Neg Hx     Social History   Tobacco Use   Smoking status: Former    Types: Cigarettes   Smokeless tobacco: Never  Vaping Use   Vaping Use: Never used  Substance Use Topics   Alcohol use: Yes    Comment: daily   Drug use: No    ROS   Objective:   Vitals: BP 94/63 (BP Location: Right Arm)   Pulse (!) 109   Temp 98.3 F (36.8 C) (Oral)   Resp 20   SpO2 96%   BP recheck was 109/79.   Wt Readings from Last 3 Encounters:  11/19/21 111 lb (50.3 kg)  05/14/21 116 lb 6 oz (52.8 kg)  02/11/21 118 lb (53.5  kg)   Temp Readings from Last 3 Encounters:  12/15/21 98.3 F (36.8 C) (Oral)  05/14/21 98.2 F (36.8 C) (Oral)  05/12/20 98.2 F (36.8 C) (Oral)   BP Readings from Last 3 Encounters:  12/15/21 94/63  11/19/21 (!) 140/82  05/14/21 120/70   Pulse Readings from Last 3 Encounters:  12/15/21 (!) 109  11/19/21 (!) 117  05/14/21 86    Physical Exam Constitutional:      General: He is not in acute distress.    Appearance: Normal appearance. He is well-developed and normal weight. He is not ill-appearing, toxic-appearing or diaphoretic.  HENT:     Head: Normocephalic and atraumatic.     Right Ear: External ear normal.     Left Ear: External ear normal.     Nose: Nose normal.     Mouth/Throat:     Mouth: Mucous membranes are moist.     Pharynx: Oropharynx is clear.  Eyes:     General: No scleral icterus.       Right eye: No discharge.        Left eye: No discharge.      Extraocular Movements: Extraocular movements intact.  Cardiovascular:     Rate and Rhythm: Normal rate and regular rhythm.     Heart sounds: Normal heart sounds. No murmur heard.    No friction rub. No gallop.  Pulmonary:     Effort: Pulmonary effort is normal. No respiratory distress.     Breath sounds: Normal breath sounds. No stridor. No wheezing, rhonchi or rales.  Abdominal:     General: Bowel sounds are normal. There is no distension.     Palpations: Abdomen is soft. There is no mass.     Tenderness: There is no abdominal tenderness. There is no right CVA tenderness, left CVA tenderness, guarding or rebound.  Musculoskeletal:     Cervical back: Normal range of motion.  Neurological:     Mental Status: He is alert and oriented to person, place, and time.  Psychiatric:        Mood and Affect: Mood normal.        Behavior: Behavior normal.        Thought Content: Thought content normal.        Judgment: Judgment normal.    ED ECG REPORT   Date: 12/15/2021  EKG Time: 1:47 PM  Rate: 111bpm  Rhythm: sinus tachycardia,  1st degree AV block  Axis: left  Intervals:first-degree A-V block , repolarization abnormality  ST&T Change: T wave flattening in I  Narrative Interpretation: Sinus tachycardia at 111 bpm with first-degree AV block, LVH with repolarization abnormality, nonspecific T wave flattening in I.  This is starkly different from his priors.  Attempted an urine sample, patient was unable to provide one.   IV fluids at 1000 cc bolus of normal saline provided together with IV Zofran 4 mg.  This was administered over period of 55 minutes.  Assessment and Plan :   PDMP not reviewed this encounter.  1. Alcohol withdrawal syndrome without complication (HCC)   2. Other fatigue   3. Weakness   4. Hyperthyroidism   5. Alcohol abuse   6. Hypotension, unspecified hypotension type     Case discussed with my medical director, Dr. Leonides Grills. We will defer ER visit for now as  patient likely has mild alcohol withdrawal syndrome.  Recommended Librium, thiamine, Zofran for supportive care.  Advised that he avoid alcohol for the foreseeable future.  Follow-up with PCP asap.  Labs pending, will redirect for significant electrolyte disturbances.  Counseled patient on potential for adverse effects with medications prescribed/recommended today, maintain strict ER and return-to-clinic precautions discussed, patient verbalized understanding.   Wallis Bamberg, PA-C 12/15/21 1500

## 2021-12-15 NOTE — ED Triage Notes (Signed)
Called to front for shaky unsteady pt-arrived to find pt standing at registration-pt fell back into w/c-pt states he [facility] self to [facility]-walked across parking lot-c/o fatigue, lack of appetite x 2 days-NAD

## 2021-12-16 ENCOUNTER — Telehealth: Payer: Self-pay | Admitting: Internal Medicine

## 2021-12-16 LAB — COMPREHENSIVE METABOLIC PANEL
ALT: 11 IU/L (ref 0–44)
AST: 41 IU/L — ABNORMAL HIGH (ref 0–40)
Albumin/Globulin Ratio: 1.6 (ref 1.2–2.2)
Albumin: 4.9 g/dL (ref 3.8–4.9)
Alkaline Phosphatase: 86 IU/L (ref 44–121)
BUN/Creatinine Ratio: 9 (ref 9–20)
BUN: 9 mg/dL (ref 6–24)
Bilirubin Total: 1.2 mg/dL (ref 0.0–1.2)
CO2: 23 mmol/L (ref 20–29)
Calcium: 9.5 mg/dL (ref 8.7–10.2)
Chloride: 89 mmol/L — ABNORMAL LOW (ref 96–106)
Creatinine, Ser: 1.04 mg/dL (ref 0.76–1.27)
Globulin, Total: 3.1 g/dL (ref 1.5–4.5)
Glucose: 145 mg/dL — ABNORMAL HIGH (ref 70–99)
Potassium: 3.3 mmol/L — ABNORMAL LOW (ref 3.5–5.2)
Sodium: 137 mmol/L (ref 134–144)
Total Protein: 8 g/dL (ref 6.0–8.5)
eGFR: 85 mL/min/{1.73_m2} (ref >59)

## 2021-12-16 LAB — CBC WITH DIFFERENTIAL/PLATELET
Basophils Absolute: 0 10*3/uL (ref 0.0–0.2)
Basos: 0 %
EOS (ABSOLUTE): 0 10*3/uL (ref 0.0–0.4)
Eos: 0 %
Hematocrit: 38.3 % (ref 37.5–51.0)
Hemoglobin: 13 g/dL (ref 13.0–17.7)
Immature Grans (Abs): 0 10*3/uL (ref 0.0–0.1)
Immature Granulocytes: 1 %
Lymphocytes Absolute: 0.7 10*3/uL (ref 0.7–3.1)
Lymphs: 11 %
MCH: 30.9 pg (ref 26.6–33.0)
MCHC: 33.9 g/dL (ref 31.5–35.7)
MCV: 91 fL (ref 79–97)
Monocytes Absolute: 0.4 10*3/uL (ref 0.1–0.9)
Monocytes: 7 %
NRBC: 1 % — ABNORMAL HIGH (ref 0–0)
Neutrophils Absolute: 5.1 10*3/uL (ref 1.4–7.0)
Neutrophils: 81 %
Platelets: 201 10*3/uL (ref 150–450)
RBC: 4.21 x10E6/uL (ref 4.14–5.80)
RDW: 15.3 % (ref 11.6–15.4)
WBC: 6.3 10*3/uL (ref 3.4–10.8)

## 2021-12-16 MED ORDER — AMLODIPINE BESYLATE 10 MG PO TABS: 10.0000 mg | ORAL_TABLET | Freq: Every day | ORAL | 0 refills | Status: DC

## 2021-12-16 NOTE — Telephone Encounter (Signed)
Rx sent 

## 2021-12-16 NOTE — Telephone Encounter (Signed)
Pt is scheduled for a follow up 12/14 but they were wondering if he could get enough medication to last until then. Please advise.   amLODipine (NORVASC) 10 MG tablet  [facility] [address] [place], [place] - [address], [place] [address] Phone: [phone]  Fax: [phone]

## 2021-12-30 ENCOUNTER — Ambulatory Visit (INDEPENDENT_AMBULATORY_CARE_PROVIDER_SITE_OTHER): Payer: Managed Care, Other (non HMO) | Admitting: Internal Medicine

## 2021-12-30 ENCOUNTER — Encounter: Payer: Self-pay | Admitting: Internal Medicine

## 2021-12-30 VITALS — BP 146/88 | HR 90 | Temp 98.7°F | Resp 16 | Ht 68.0 in | Wt 119.1 lb

## 2021-12-30 DIAGNOSIS — E538 Deficiency of other specified B group vitamins: Secondary | ICD-10-CM

## 2021-12-30 DIAGNOSIS — F1010 Alcohol abuse, uncomplicated: Secondary | ICD-10-CM

## 2021-12-30 DIAGNOSIS — E519 Thiamine deficiency, unspecified: Secondary | ICD-10-CM | POA: Diagnosis not present

## 2021-12-30 DIAGNOSIS — I10 Essential (primary) hypertension: Secondary | ICD-10-CM

## 2021-12-30 MED ORDER — AMLODIPINE BESYLATE 10 MG PO TABS: 10.0000 mg | ORAL_TABLET | Freq: Every day | ORAL | 1 refills | Status: DC

## 2021-12-30 MED ORDER — ESCITALOPRAM OXALATE 10 MG PO TABS: 10.0000 mg | ORAL_TABLET | Freq: Every day | ORAL | 1 refills | Status: DC

## 2021-12-30 MED ORDER — THIAMINE MONONITRATE 100 MG PO TABS: 100.0000 mg | ORAL_TABLET | Freq: Every day | ORAL | 3 refills | Status: AC

## 2021-12-30 MED ORDER — FOLIC ACID 1 MG PO TABS: 2.0000 mg | ORAL_TABLET | Freq: Every day | ORAL | 3 refills | Status: DC

## 2021-12-30 NOTE — Assessment & Plan Note (Signed)
HTN: On amlodipine, RF sent. Anxiety: On Lexapro, controlled, RF is needed Folic acid and thiamine deficiency: Encouraged good compliance with vitamins. Hyperthyroidism: Last visit with Endo 11/19/2021 currently on no medicines EtOH: Still drinks daily "1 or 2 beers" qd, some days "3-4". Encouraged abstinence, I understand is difficult, encouraged to reach out to AA. RTC CPX by [phone]

## 2021-12-30 NOTE — Patient Instructions (Signed)
We are refilling your medications today  Check the  blood pressure regularly BP GOAL is between 110/65 and  135/85. If it is consistently higher or lower, let me know      GO TO THE FRONT DESK, PLEASE SCHEDULE YOUR APPOINTMENTS Come back for   a physical exam by April 2024

## 2021-12-30 NOTE — Progress Notes (Signed)
   Subjective:    Patient ID: Hunter Mitchell, male    DOB: October 12, 1965, 56 y.o.   MRN: [id number]  DOS:  12/30/2021 Type of visit - description: Follow-up  Here for follow-up, needs refills. Saw endocrinology, note reviewed. Was seen at the ER, had EtOH withdrawals. Was prescribed Librium. Feels better.   Review of Systems See above   Past Medical History:  Diagnosis Date   Anxiety    Hypertension    Syncope 11/17/2009   ECHO 01-2010 normal   Thyroid disease     Past Surgical History:  Procedure Laterality Date   NO PAST SURGERIES      Current Outpatient Medications  Medication Instructions   amLODipine (NORVASC) 10 mg, Oral, Daily at bedtime   Ascorbic Acid (VITAMIN C PO) 1 tablet, Oral, Daily   b complex vitamins tablet 1 tablet, Oral, Daily   chlordiazePOXIDE (LIBRIUM) 25 mg, Oral, 2 times daily   cholecalciferol (VITAMIN D3) 2,000 Units, Oral, Daily   Docosahexaenoic Acid (DHA PO) 1 tablet, Oral, Daily   escitalopram (LEXAPRO) 10 mg, Oral, Daily   folic acid (FOLVITE) 2 mg, Oral, Daily   Multiple Vitamin (MULTIVITAMIN WITH MINERALS) TABS tablet 1 tablet, Oral, Daily   Omega-3 Fatty Acids (FISH OIL PO) 1 capsule, Oral, Daily   ondansetron (ZOFRAN-ODT) 8 mg, Oral, Every 8 hours PRN   thiamine (VITAMIN B1) 100 mg, Oral, Daily       Objective:   Physical Exam BP (!) 142/84   Pulse 90   Temp 98.7 F (37.1 C) (Oral)   Resp 16   Ht 5\' 8"  (1.727 m)   Wt 119 lb 2 oz (54 kg)   SpO2 96%   BMI 18.11 kg/m  General:   Well developed, NAD, BMI noted. HEENT:  Normocephalic . Face symmetric, atraumatic Lungs:  CTA B Normal respiratory effort, no intercostal retractions, no accessory muscle use. Heart: RRR,  no murmur.  Lower extremities: no pretibial edema bilaterally  Skin: Not pale. Not jaundice Neurologic:  alert & oriented X3.  Speech normal, gait appropriate for age and unassisted Psych--  Cognition and judgment appear intact.  Cooperative with  normal attention span and concentration.  Behavior appropriate. No anxious or depressed appearing.      Assessment     ASSESSMENT Hypertensive emergency 06/2018 Hyperthyroidism, per endo Anxiety (started lexapro 06/2018) CV: --Syncope, normal echo 2012 --Syncope 06/2018, work-up negative Liver: ---Alcohol abuse DX 06/2018 ---Hep B: Labs on 7-20 20: HBsAb+, HBsAg- consistent with immunity. The HBV viral load (DNA) was undetectable Vitamin deficiencies: Vitamin D (07-2018), folic acid and thiamine (Dx 2023)  PLAN: HTN: On amlodipine, RF sent. Anxiety: On Lexapro, controlled, RF is needed Folic acid and thiamine deficiency: Encouraged good compliance with vitamins. Hyperthyroidism: Last visit with Endo 11/19/2021 currently on no medicines EtOH: Still drinks daily "1 or 2 beers" qd, some days "3-4". Encouraged abstinence, I understand is difficult, encouraged to reach out to AA. RTC CPX by [phone]

## 2022-05-20 ENCOUNTER — Ambulatory Visit (INDEPENDENT_AMBULATORY_CARE_PROVIDER_SITE_OTHER): Payer: Managed Care, Other (non HMO) | Admitting: Internal Medicine

## 2022-05-20 ENCOUNTER — Encounter: Payer: Self-pay | Admitting: Internal Medicine

## 2022-05-20 VITALS — BP 120/64 | HR 70 | Ht 68.0 in | Wt 110.4 lb

## 2022-05-20 DIAGNOSIS — E0590 Thyrotoxicosis, unspecified without thyrotoxic crisis or storm: Secondary | ICD-10-CM | POA: Diagnosis not present

## 2022-05-20 NOTE — Patient Instructions (Addendum)
Please stop at the lab.  Continue off Methimazole.  Please return in 6 months.

## 2022-05-20 NOTE — Progress Notes (Unsigned)
Patient ID: Hunter Mitchell, male   DOB: Jan 20, 1965, 57 y.o.   MRN: [id number]  HPI  Hunter Mitchell is a 57 y.o.-year-old male, returning for follow-up for thyrotoxicosis.  He previously saw Dr. Everardo All, but last visit with me 6 months ago.  Interim history: He denies palpitations, further weight loss, heat intolerance, and feels that tremors and anxiety have improved. He is not drinking much less: 1 alcoholic drink every once in a while.  Reviewed and addended history: He was diagnosed with thyrotoxicosis in 2012.  He did not have imaging for this.  It was assumed that he had Graves' disease and he was started on methimazole.  At our first visit in 11/2021, he was on methimazole 5 mg 3 times a week.  We stopped the methimazole at that time.  He did not return for repeat thyroid test afterwards.  I reviewed pt's thyroid tests: Lab Results  Component Value Date   TSH 0.84 11/19/2021   TSH 0.60 02/11/2021   TSH 0.40 08/11/2020   TSH 0.73 02/11/2020   TSH 1.11 10/11/2019   TSH 1.18 07/10/2019   TSH 0.32 (L) 03/11/2019   TSH 0.30 (L) 12/10/2018   TSH 1.136 07/11/2018   TSH 0.35 07/04/2018   FREET4 0.58 (L) 11/19/2021   FREET4 0.56 (L) 02/11/2021   FREET4 0.59 (L) 08/11/2020   FREET4 0.66 02/11/2020   FREET4 0.65 10/11/2019   FREET4 0.61 07/10/2019   FREET4 0.38 (L) 03/11/2019   FREET4 0.62 12/10/2018   FREET4 0.70 07/04/2018   FREET4 0.64 06/21/2017   T3FREE 3.7 11/19/2021   T3FREE 3.1 10/14/2014   Antithyroid antibodies: Lab Results  Component Value Date   TSI <89 11/19/2021   Of note, he has a history of transaminitis (previous AST has been as high as 764): Lab Results  Component Value Date   ALT 11 12/15/2021   AST 41 (H) 12/15/2021   ALKPHOS 86 12/15/2021   BILITOT 1.2 12/15/2021    He also has a history of undetectable B1 vitamin.  He is taking a B1 supplement.  Upon question then, he has a history of alcohol abuse.  Now trying to cut down.  Pt  denies: - feeling nodules in neck - hoarseness - dysphagia - choking  But has difficulty with large pills.  He has: - tremors - anxiety - on medications  Pt does not have a FH of thyroid ds. No FH of thyroid cancer. No h/o radiation tx to head or neck. No seaweed or kelp, no recent contrast studies. No steroid use. No herbal supplements. + Biotin use - B complex - last dose 2 mo ago.  Pt. also has a history of HTN, history of syncope.  ROS:  + see HPI  Past Medical History:  Diagnosis Date   Anxiety    Hypertension    Syncope 11/17/2009   ECHO 01-2010 normal   Thyroid disease    Past Surgical History:  Procedure Laterality Date   NO PAST SURGERIES     Social History   Socioeconomic History   Marital status: Married    Spouse name: Not on file   Number of children: 3   Years of education: Not on file   Highest education level: Not on file  Occupational History   Occupation: [employer]  Tobacco Use   Smoking status: Former    Types: Cigarettes   Smokeless tobacco: Never  Vaping Use   Vaping Use: Never used  Substance and Sexual Activity  Alcohol use: Yes    Comment: daily   Drug use: No   Sexual activity: Not on file  Other Topics Concern   Not on file  Social History Narrative   Household- pt , wife    Children x 3,biological, 4 step children      Social Determinants of Health   Financial Resource Strain: Not on file  Food Insecurity: Not on file  Transportation Needs: Not on file  Physical Activity: Not on file  Stress: Not on file  Social Connections: Not on file  Intimate Partner Violence: Not on file   Current Outpatient Medications on File Prior to Visit  Medication Sig Dispense Refill   amLODipine (NORVASC) 10 MG tablet Take 1 tablet (10 mg total) by mouth at bedtime. (Patient not taking: Reported on 05/20/2022) 90 tablet 1   Ascorbic Acid (VITAMIN C PO) Take 1 tablet by mouth daily.     b complex vitamins tablet Take 1 tablet by mouth daily.      cholecalciferol (VITAMIN D3) 25 MCG (1000 UNIT) tablet Take 2,000 Units by mouth daily.     Docosahexaenoic Acid (DHA PO) Take 1 tablet by mouth daily.     escitalopram (LEXAPRO) 10 MG tablet Take 1 tablet (10 mg total) by mouth daily. 90 tablet 1   folic acid (FOLVITE) 1 MG tablet Take 2 tablets (2 mg total) by mouth daily. 180 tablet 3   Multiple Vitamin (MULTIVITAMIN WITH MINERALS) TABS tablet Take 1 tablet by mouth daily.     Omega-3 Fatty Acids (FISH OIL PO) Take 1 capsule by mouth daily.     ondansetron (ZOFRAN-ODT) 8 MG disintegrating tablet Take 1 tablet (8 mg total) by mouth every 8 (eight) hours as needed for nausea or vomiting. (Patient not taking: Reported on 12/30/2021) 20 tablet 0   thiamine (VITAMIN B1) 100 MG tablet Take 1 tablet (100 mg total) by mouth daily. 90 tablet 3   No current facility-administered medications on file prior to visit.   No Known Allergies Family History  Problem Relation Age of Onset   Cancer Father        ?liver   Colon cancer Neg Hx    Breast cancer Neg Hx    Prostate cancer Neg Hx    Heart attack Neg Hx    Diabetes Neg Hx    Thyroid disease Neg Hx    PE: BP 120/64 (BP Location: Right Arm, Patient Position: Sitting, Cuff Size: Normal)   Pulse 70   Ht 5\' 8"  (1.727 m)   Wt 110 lb 6.4 oz (50.1 kg)   SpO2 97%   BMI 16.79 kg/m  Wt Readings from Last 3 Encounters:  05/20/22 110 lb 6.4 oz (50.1 kg)  12/30/21 119 lb 2 oz (54 kg)  11/19/21 111 lb (50.3 kg)   Constitutional: underweight, in NAD Eyes: no exophthalmos, no lid lag, no stare ENT: no thyromegaly,  no cervical lymphadenopathy Cardiovascular: RRR, No MRG Respiratory: CTA B Musculoskeletal: no deformities Skin: moist, warm, no rashes Neurological: no tremor with outstretched hands  ASSESSMENT: 1. Thyrotoxicosis  PLAN:  1. Patient with a history of thyrotoxicosis, Graves' disease, but with normal TSI antibodies checked at last visit.  At that time, he was on the low-dose of  methimazole, 5 mg 3 times a week, tolerated well -At last visit, we checked his TFTs and they were at goal so advised him to stop the methimazole -Unfortunately, he did not return for labs afterwards -At today's visit, he has  no symptoms or signs of thyrotoxicosis: No palpitations, high heart rate, no tremors on exam, his anxiety is better, no heat intolerance, and he did not lose a significant amount of weight since last visit. -We will recheck his TFTs today and see if he needs to restart methimazole - No need for beta-blockers today since no tremors or palpitations -No signs of active Graves' ophthalmopathy: No double vision, blurry vision, eye pain, chemosis. -I will see him back in 6 months but we did discuss that if we end up changing his treatment plan, he needs to have labs in 4-5 weeks afterwards.  Will need methimazole refills if we  restart this.  Carlus Pavlov, MD PhD [facility] End[facility]

## 2022-05-21 LAB — TSH: TSH: 0.2 mIU/L — ABNORMAL LOW (ref 0.40–4.50)

## 2022-05-21 LAB — T3, FREE: T3, Free: 3.3 pg/mL (ref 2.3–4.2)

## 2022-05-21 LAB — T4, FREE: Free T4: 0.9 ng/dL (ref 0.8–1.8)

## 2022-05-27 ENCOUNTER — Ambulatory Visit: Payer: Managed Care, Other (non HMO) | Admitting: Internal Medicine

## 2022-05-27 ENCOUNTER — Encounter: Payer: Self-pay | Admitting: Internal Medicine

## 2022-05-27 VITALS — BP 126/76 | HR 83 | Temp 98.3°F | Resp 16 | Ht 68.0 in | Wt 107.2 lb

## 2022-05-27 DIAGNOSIS — E538 Deficiency of other specified B group vitamins: Secondary | ICD-10-CM | POA: Diagnosis not present

## 2022-05-27 DIAGNOSIS — E519 Thiamine deficiency, unspecified: Secondary | ICD-10-CM

## 2022-05-27 DIAGNOSIS — I10 Essential (primary) hypertension: Secondary | ICD-10-CM | POA: Diagnosis not present

## 2022-05-27 DIAGNOSIS — Z0001 Encounter for general adult medical examination with abnormal findings: Secondary | ICD-10-CM | POA: Diagnosis not present

## 2022-05-27 DIAGNOSIS — K529 Noninfective gastroenteritis and colitis, unspecified: Secondary | ICD-10-CM | POA: Diagnosis not present

## 2022-05-27 DIAGNOSIS — R739 Hyperglycemia, unspecified: Secondary | ICD-10-CM

## 2022-05-27 DIAGNOSIS — Z122 Encounter for screening for malignant neoplasm of respiratory organs: Secondary | ICD-10-CM

## 2022-05-27 DIAGNOSIS — Z0000 Encounter for general adult medical examination without abnormal findings: Secondary | ICD-10-CM

## 2022-05-27 MED ORDER — ONDANSETRON 8 MG PO TBDP: 8.0000 mg | ORAL_TABLET | Freq: Three times a day (TID) | ORAL | 0 refills | Status: DC | PRN

## 2022-05-27 NOTE — Assessment & Plan Note (Signed)
-  Td  03/30/2016 -Vaccines I recommend: Shingrix, COVID-vaccine, PNM 20 (alcohol abuse), flu shot every fall. --CCS: Cologuard NEG 07-2021 --DRE normal 2022, re check PSA --Labs:CMP, FLP, CBC, A1c, folic acid, B12, thiamin, PSA -Diet and exercise discussed - Healthcare POA: See AVS -Lung cancer screening: Quit in 2021, scheduled a CT

## 2022-05-27 NOTE — Assessment & Plan Note (Signed)
Here for CPX see separate the presentation.  Chronic medical problems and an acute issue were also addressed Acute gastroenteritis: Symptoms started 2 days ago, overall feeling better.  Recommend to continue good hydration.  RF nausea.  Call if not gradually better Hypertension: BP today satisfactory, on amlodipine.  No change Hypothyroidism: Last TSH slightly suppressed, per Endo Weight loss: Weight loss noted, the patient reports that the majority of the weight loss has happened in the last few days.  Will monitor the situation.  RTC 3 months. Alcohol abuse: Reports moderation, having a glass of wine at night. Vitamin deficiencies: Checking labs RTC labs next week RTC checkup in 3 months

## 2022-05-27 NOTE — Patient Instructions (Addendum)
Vaccines I recommend: Shingrix (shingles) COVID-vaccine Pneumonia shot (PNM 20) Flu shot every fall  Drink plenty of fluids. If you are not gradually better let us know  Check the  blood pressure regularly BP GOAL is between 110/65 and  135/85. If it is consistently higher or lower, let me know      GO TO THE FRONT DESK, PLEASE SCHEDULE YOUR APPOINTMENTS Come back for   blood work next week  Come back for a checkup in 3 months      "Health Care Power of attorney" ,  "Living will" (Advance care planning documents)  If you already have a living will or healthcare power of attorney, is recommended you bring the copy to be scanned in your chart.   The document will be available to all the doctors you see in the system.  Advance care planning is a process that supports adults in  understanding and sharing their preferences regarding future medical care.  The patient's preferences are recorded in documents called Advance Directives and the can be modified at any time while the patient is in full mental capacity.   If you don't have one, please consider create one.      More information at: [email or web address]

## 2022-05-27 NOTE — Progress Notes (Signed)
Subjective:    Patient ID: Hunter Mitchell, male    DOB: 31-Dec-1965, 57 y.o.   MRN: [id number]  DOS:  05/27/2022 Type of visit - description: cpx  Here for CPX.  Also developed an acute illness. Sudden onset of nausea vomiting and diarrhea 2 days ago. Denies hematemesis or blood in the stools.  Stools are watery, brown in color.  No major abdominal pain.  No fever or chills. He has been able to drink fluids and denies feeling dizzy. Overall symptoms have decreased in the last day.   Wt Readings from Last 3 Encounters:  05/27/22 107 lb 4 oz (48.6 kg)  05/20/22 110 lb 6.4 oz (50.1 kg)  12/30/21 119 lb 2 oz (54 kg)   Review of Systems  Other than above, a 14 point review of systems is negative     Past Medical History:  Diagnosis Date   Anxiety    Hypertension    Syncope 11/17/2009   ECHO 01-2010 normal   Thyroid disease     Past Surgical History:  Procedure Laterality Date   NO PAST SURGERIES     Social History   Socioeconomic History   Marital status: Married    Spouse name: Not on file   Number of children: 3   Years of education: Not on file   Highest education level: Not on file  Occupational History   Occupation: [employer]  Tobacco Use   Smoking status: Former    Types: Cigarettes   Smokeless tobacco: Never   Tobacco comments:    1 ppd from 1986 to 2021  Vaping Use   Vaping Use: Never used  Substance and Sexual Activity   Alcohol use: Yes    Comment: daily   Drug use: No   Sexual activity: Not on file  Other Topics Concern   Not on file  Social History Narrative   Household- pt , wife    Children x 3,biological, 4 step children      Social Determinants of Health   Financial Resource Strain: Not on file  Food Insecurity: Not on file  Transportation Needs: Not on file  Physical Activity: Not on file  Stress: Not on file  Social Connections: Not on file  Intimate Partner Violence: Not on file     Current Outpatient Medications   Medication Instructions   amLODipine (NORVASC) 10 mg, Oral, Daily at bedtime   Ascorbic Acid (VITAMIN C PO) 1 tablet, Oral, Daily   b complex vitamins tablet 1 tablet, Oral, Daily   cholecalciferol (VITAMIN D3) 2,000 Units, Oral, Daily   Docosahexaenoic Acid (DHA PO) 1 tablet, Oral, Daily   escitalopram (LEXAPRO) 10 mg, Oral, Daily   folic acid (FOLVITE) 2 mg, Oral, Daily   Multiple Vitamin (MULTIVITAMIN WITH MINERALS) TABS tablet 1 tablet, Oral, Daily   Omega-3 Fatty Acids (FISH OIL PO) 1 capsule, Oral, Daily   ondansetron (ZOFRAN-ODT) 8 mg, Oral, Every 8 hours PRN   thiamine (VITAMIN B1) 100 mg, Oral, Daily       Objective:   Physical Exam BP 126/76   Pulse 83   Temp 98.3 F (36.8 C) (Oral)   Resp 16   Ht 5\' 8"  (1.727 m)   Wt 107 lb 4 oz (48.6 kg)   SpO2 98%   BMI 16.31 kg/m  General: Well developed, NAD, underweight appearing.  Nontoxic. HEENT:  Normocephalic . Face symmetric, atraumatic Lungs:  CTA B Normal respiratory effort, no intercostal retractions, no accessory muscle use. Heart: RRR,  no murmur.  Abdomen:  Not distended, soft, non-tender. No rebound or rigidity.   Lower extremities: no pretibial edema bilaterally  Skin: Exposed areas without rash. Not pale. Not jaundice Neurologic:  alert & oriented X3.  Speech normal, gait appropriate for age and unassisted Strength symmetric and appropriate for age.  Psych: Cognition and judgment appear intact.  Cooperative with normal attention span and concentration.  Behavior appropriate. No anxious or depressed appearing.     Assessment       ASSESSMENT Hypertensive emergency 06/2018 Hyperthyroidism, per endo Anxiety (started lexapro 06/2018) CV: --Syncope, normal echo 2012 --Syncope 06/2018, work-up negative Liver: ---Alcohol abuse DX 06/2018 ---Hep B: Labs on 7-20 20: HBsAb+, HBsAg- consistent with immunity. The HBV viral load (DNA) was undetectable Vitamin deficiencies: Vitamin D (07-2018), folic acid  and thiamine (Dx 2023)  PLAN: Here for CPX see separate the presentation.  Chronic medical problems and an acute issue were also addressed Acute gastroenteritis: Symptoms started 2 days ago, overall feeling better.  Recommend to continue good hydration.  RF nausea.  Call if not gradually better Hypertension: BP today satisfactory, on amlodipine.  No change Hypothyroidism: Last TSH slightly suppressed, per Endo Weight loss: Weight loss noted, the patient reports that the majority of the weight loss has happened in the last few days.  Will monitor the situation.  RTC 3 months. Alcohol abuse: Reports moderation, having a glass of wine at night. Vitamin deficiencies: Checking labs RTC labs next week RTC checkup in 3 months   In addition to CPX, I managed his chronic medical problems including hypertension, vitamin deficiencies, alcohol abuse.  Also an acute problem: acute gastroenteritis.

## 2022-06-03 ENCOUNTER — Other Ambulatory Visit (INDEPENDENT_AMBULATORY_CARE_PROVIDER_SITE_OTHER): Payer: Managed Care, Other (non HMO)

## 2022-06-03 DIAGNOSIS — E519 Thiamine deficiency, unspecified: Secondary | ICD-10-CM | POA: Diagnosis not present

## 2022-06-03 DIAGNOSIS — I10 Essential (primary) hypertension: Secondary | ICD-10-CM | POA: Diagnosis not present

## 2022-06-03 DIAGNOSIS — Z0000 Encounter for general adult medical examination without abnormal findings: Secondary | ICD-10-CM | POA: Diagnosis not present

## 2022-06-03 DIAGNOSIS — R739 Hyperglycemia, unspecified: Secondary | ICD-10-CM | POA: Diagnosis not present

## 2022-06-03 DIAGNOSIS — E538 Deficiency of other specified B group vitamins: Secondary | ICD-10-CM | POA: Diagnosis not present

## 2022-06-03 LAB — LIPID PANEL
Cholesterol: 215 mg/dL — ABNORMAL HIGH (ref 0–200)
HDL: 148.7 mg/dL (ref >39.00)
LDL Cholesterol: 54 mg/dL (ref 0–99)
NonHDL: 65.98
Total CHOL/HDL Ratio: 1
Triglycerides: 61 mg/dL (ref 0.0–149.0)
VLDL: 12.2 mg/dL (ref 0.0–40.0)

## 2022-06-03 LAB — COMPREHENSIVE METABOLIC PANEL
ALT: 24 U/L (ref 0–53)
AST: 46 U/L — ABNORMAL HIGH (ref 0–37)
Albumin: 4.3 g/dL (ref 3.5–5.2)
Alkaline Phosphatase: 60 U/L (ref 39–117)
BUN: 17 mg/dL (ref 6–23)
CO2: 25 mEq/L (ref 19–32)
Calcium: 8.9 mg/dL (ref 8.4–10.5)
Chloride: 98 mEq/L (ref 96–112)
Creatinine, Ser: 0.72 mg/dL (ref 0.40–1.50)
GFR: 102.97 mL/min (ref >60.00)
Glucose, Bld: 119 mg/dL — ABNORMAL HIGH (ref 70–99)
Potassium: 3.7 mEq/L (ref 3.5–5.1)
Sodium: 134 mEq/L — ABNORMAL LOW (ref 135–145)
Total Bilirubin: 0.9 mg/dL (ref 0.2–1.2)
Total Protein: 7.2 g/dL (ref 6.0–8.3)

## 2022-06-03 LAB — CBC WITH DIFFERENTIAL/PLATELET
Basophils Absolute: 0 10*3/uL (ref 0.0–0.1)
Basophils Relative: 0.7 % (ref 0.0–3.0)
Eosinophils Absolute: 0 10*3/uL (ref 0.0–0.7)
Eosinophils Relative: 0.3 % (ref 0.0–5.0)
HCT: 37.1 % — ABNORMAL LOW (ref 39.0–52.0)
Hemoglobin: 12.4 g/dL — ABNORMAL LOW (ref 13.0–17.0)
Lymphocytes Relative: 18.8 % (ref 12.0–46.0)
Lymphs Abs: 0.8 10*3/uL (ref 0.7–4.0)
MCHC: 33.4 g/dL (ref 30.0–36.0)
MCV: 90 fl (ref 78.0–100.0)
Monocytes Absolute: 0.3 10*3/uL (ref 0.1–1.0)
Monocytes Relative: 8.5 % (ref 3.0–12.0)
Neutro Abs: 2.9 10*3/uL (ref 1.4–7.7)
Neutrophils Relative %: 71.7 % (ref 43.0–77.0)
Platelets: 182 10*3/uL (ref 150.0–400.0)
RBC: 4.12 Mil/uL — ABNORMAL LOW (ref 4.22–5.81)
RDW: 15.3 % (ref 11.5–15.5)
WBC: 4.1 10*3/uL (ref 4.0–10.5)

## 2022-06-03 LAB — B12 AND FOLATE PANEL
Folate: 16.9 ng/mL (ref >5.9)
Vitamin B-12: 520 pg/mL (ref 211–911)

## 2022-06-03 LAB — PSA: PSA: 2.02 ng/mL (ref 0.10–4.00)

## 2022-06-03 LAB — HEMOGLOBIN A1C: Hgb A1c MFr Bld: 5.3 % (ref 4.6–6.5)

## 2022-06-06 MED ORDER — ATORVASTATIN CALCIUM 10 MG PO TABS: 10.0000 mg | ORAL_TABLET | Freq: Every day | ORAL | 4 refills | Status: DC

## 2022-06-06 NOTE — Addendum Note (Signed)
Addended byConrad Delavan D on: 06/06/2022 07:52 AM   Modules accepted: Orders

## 2022-08-26 ENCOUNTER — Ambulatory Visit (INDEPENDENT_AMBULATORY_CARE_PROVIDER_SITE_OTHER): Payer: Managed Care, Other (non HMO) | Admitting: Internal Medicine

## 2022-08-26 VITALS — BP 126/72 | HR 64 | Temp 98.2°F | Resp 16 | Wt 112.0 lb

## 2022-08-26 DIAGNOSIS — E0590 Thyrotoxicosis, unspecified without thyrotoxic crisis or storm: Secondary | ICD-10-CM

## 2022-08-26 DIAGNOSIS — E785 Hyperlipidemia, unspecified: Secondary | ICD-10-CM

## 2022-08-26 DIAGNOSIS — R634 Abnormal weight loss: Secondary | ICD-10-CM | POA: Diagnosis not present

## 2022-08-26 LAB — LIPID PANEL
Cholesterol: 183 mg/dL (ref 0–200)
HDL: 121.2 mg/dL (ref >39.00)
LDL Cholesterol: 51 mg/dL (ref 0–99)
NonHDL: 62.21
Total CHOL/HDL Ratio: 2
Triglycerides: 55 mg/dL (ref 0.0–149.0)
VLDL: 11 mg/dL (ref 0.0–40.0)

## 2022-08-26 LAB — T3, FREE: T3, Free: 2.9 pg/mL (ref 2.3–4.2)

## 2022-08-26 LAB — ALT: ALT: 29 U/L (ref 0–53)

## 2022-08-26 LAB — TSH: TSH: 0.14 u[IU]/mL — ABNORMAL LOW (ref 0.35–5.50)

## 2022-08-26 LAB — T4, FREE: Free T4: 0.54 ng/dL — ABNORMAL LOW (ref 0.60–1.60)

## 2022-08-26 LAB — AST: AST: 34 U/L (ref 0–37)

## 2022-08-26 NOTE — Patient Instructions (Signed)
    GO TO THE LAB : Get the blood work     GO TO THE FRONT DESK, PLEASE SCHEDULE YOUR APPOINTMENTS Come back for a checkup in 6 months 

## 2022-08-26 NOTE — Progress Notes (Signed)
   Subjective:    Patient ID: Hunter Mitchell, male    DOB: 04-26-1965, 57 y.o.   MRN: [id number]  DOS:  08/26/2022 Type of visit - description: Routine follow-up  He feels well. Started atorvastatin Weight loss: since LOV,  weight is going back to normal.  Reports normal appetite, denies nausea vomiting or diarrhea.  Wt Readings from Last 3 Encounters:  08/26/22 112 lb (50.8 kg)  05/27/22 107 lb 4 oz (48.6 kg)  05/20/22 110 lb 6.4 oz (50.1 kg)     Review of Systems See above   Past Medical History:  Diagnosis Date   Anxiety    Hypertension    Syncope 11/17/2009   ECHO 01-2010 normal   Thyroid disease     Past Surgical History:  Procedure Laterality Date   NO PAST SURGERIES      Current Outpatient Medications  Medication Instructions   amLODipine (NORVASC) 10 mg, Oral, Daily at bedtime   Ascorbic Acid (VITAMIN C PO) 1 tablet, Oral, Daily   atorvastatin (LIPITOR) 10 mg, Oral, Daily at bedtime   b complex vitamins tablet 1 tablet, Oral, Daily   cholecalciferol (VITAMIN D3) 2,000 Units, Oral, Daily   Docosahexaenoic Acid (DHA PO) 1 tablet, Oral, Daily   escitalopram (LEXAPRO) 10 mg, Oral, Daily   folic acid (FOLVITE) 2 mg, Oral, Daily   Multiple Vitamin (MULTIVITAMIN WITH MINERALS) TABS tablet 1 tablet, Oral, Daily   Omega-3 Fatty Acids (FISH OIL PO) 1 capsule, Oral, Daily   ondansetron (ZOFRAN-ODT) 8 mg, Oral, Every 8 hours PRN   thiamine (VITAMIN B1) 100 mg, Oral, Daily       Objective:   Physical Exam BP 126/72 (BP Location: Left Arm, Patient Position: Sitting, Cuff Size: Small)   Pulse 64   Temp 98.2 F (36.8 C) (Oral)   Resp 16   Wt 112 lb (50.8 kg)   SpO2 100%   BMI 17.03 kg/m  General:   Well developed, NAD, BMI noted. HEENT:  Normocephalic . Face symmetric, atraumatic Skin: Not pale. Not jaundice Neurologic:  alert & oriented X3.  Speech normal, gait appropriate for age and unassisted Psych--  Cognition and judgment appear intact.   Cooperative with normal attention span and concentration.  Behavior appropriate. No anxious or depressed appearing.      Assessment       ASSESSMENT Hypertensive emergency 06/2018 Dyslipidemia Hyperthyroidism, per endo Anxiety (started lexapro 06/2018) CV: --Syncope, normal echo 2012 --Syncope 06/2018, work-up negative Liver: ---Alcohol abuse DX 06/2018 ---Hep B: Labs on 7-20 20: HBsAb+, HBsAg- consistent with immunity. The HBV viral load (DNA) was undetectable Vitamin deficiencies: Vitamin D (07-2018), folic acid and thiamine (Dx 2023)  PLAN: Weight loss: Asked to come back today to check for weight loss, fortunately he is feeling well, weight is going up.  No further testing is needed. Dyslipidemia: Based on last FLP, started atorvastatin, reports good compliance and tolerance.  Check a FLP AST ALT Hypothyroidism: Has not pursue labs as recommended by Endo,  labs today and share them w/ endo EtOH: Again reports moderation, 1 glass of wine at night. RTC 6 months

## 2022-08-28 NOTE — Assessment & Plan Note (Signed)
Weight loss: Asked to come back today to check for weight loss, fortunately he is feeling well, weight is going up.  No further testing is needed. Dyslipidemia: Based on last FLP, started atorvastatin, reports good compliance and tolerance.  Check a FLP AST ALT Hypothyroidism: Has not pursue labs as recommended by Endo,  labs today and share them w/ endo EtOH: Again reports moderation, 1 glass of wine at night. RTC 6 months

## 2022-10-28 ENCOUNTER — Other Ambulatory Visit: Payer: Self-pay | Admitting: Internal Medicine

## 2022-11-21 ENCOUNTER — Ambulatory Visit: Payer: Managed Care, Other (non HMO) | Admitting: Internal Medicine

## 2022-11-21 NOTE — Progress Notes (Deleted)
Patient ID: Hunter Mitchell, male   DOB: 1965/06/16, 57 y.o.   MRN: [id number]  HPI  Hunter Mitchell is a 57 y.o.-year-old male, returning for follow-up for thyrotoxicosis.  He previously saw Dr. Everardo All, but last visit with me 6 months ago.  Interim history: He denies palpitations, weight loss, heat intolerance, and feels that tremors and anxiety have improved. He is not drinking much less: 1 alcoholic drink every once in a while.  Reviewed and addended history: He was diagnosed with thyrotoxicosis in 2012.  He did not have imaging for this.  It was assumed that he had Graves' disease and he was started on methimazole.  At our first visit in 11/2021, he was on methimazole 5 mg 3 times a week.  We stopped the methimazole at that time.  He did not return for repeat thyroid test in 1 mo, and TSH returned low when checked in 05/2022.  I reviewed pt's thyroid tests: Lab Results  Component Value Date   TSH 0.14 (L) 08/26/2022   TSH 0.20 (L) 05/20/2022   TSH 0.84 11/19/2021   TSH 0.60 02/11/2021   TSH 0.40 08/11/2020   TSH 0.73 02/11/2020   TSH 1.11 10/11/2019   TSH 1.18 07/10/2019   TSH 0.32 (L) 03/11/2019   TSH 0.30 (L) 12/10/2018   FREET4 0.54 (L) 08/26/2022   FREET4 0.9 05/20/2022   FREET4 0.58 (L) 11/19/2021   FREET4 0.56 (L) 02/11/2021   FREET4 0.59 (L) 08/11/2020   FREET4 0.66 02/11/2020   FREET4 0.65 10/11/2019   FREET4 0.61 07/10/2019   FREET4 0.38 (L) 03/11/2019   FREET4 0.62 12/10/2018   T3FREE 2.9 08/26/2022   T3FREE 3.3 05/20/2022   T3FREE 3.7 11/19/2021   T3FREE 3.1 10/14/2014   Antithyroid antibodies: Lab Results  Component Value Date   TSI <89 11/19/2021   Of note, he has a history of transaminitis (previous AST has been as high as 764): Lab Results  Component Value Date   ALT 29 08/26/2022   AST 34 08/26/2022   ALKPHOS 60 06/03/2022   BILITOT 0.9 06/03/2022    He has a history of alcohol abuse.  Now trying to cut down. He also has a history  of undetectable B1 vitamin.  He is taking a B1 supplement.  Pt denies: - feeling nodules in neck - hoarseness - dysphagia - choking  But has difficulty with large pills.  He has: - tremors - anxiety - on medications  Pt does not have a FH of thyroid ds. No FH of thyroid cancer. No h/o radiation tx to head or neck. No seaweed or kelp, no recent contrast studies. No steroid use. No herbal supplements. + Biotin use - B complex.  Pt. also has a history of HTN, history of syncope.  ROS:  + see HPI  Past Medical History:  Diagnosis Date   Anxiety    Hypertension    Syncope 11/17/2009   ECHO 01-2010 normal   Thyroid disease    Past Surgical History:  Procedure Laterality Date   NO PAST SURGERIES     Social History   Socioeconomic History   Marital status: Married    Spouse name: Not on file   Number of children: 3   Years of education: Not on file   Highest education level: Not on file  Occupational History   Occupation: [employer]  Tobacco Use   Smoking status: Former    Current packs/day: 0.00    Average packs/day: 1 pack/day for 35.0  years (35.0 ttl pk-yrs)    Types: Cigarettes    Start date: 62    Quit date: 2021    Years since quitting: 3.8   Smokeless tobacco: Never   Tobacco comments:    1 ppd from 1986 to 2021  Vaping Use   Vaping status: Never Used  Substance and Sexual Activity   Alcohol use: Yes    Comment: daily   Drug use: No   Sexual activity: Not on file  Other Topics Concern   Not on file  Social History Narrative   Household- pt , wife    Children x 3,biological, 4 step children      Social Determinants of Health   Financial Resource Strain: Not on file  Food Insecurity: Not on file  Transportation Needs: Not on file  Physical Activity: Not on file  Stress: Not on file  Social Connections: Not on file  Intimate Partner Violence: Not on file   Current Outpatient Medications on File Prior to Visit  Medication Sig Dispense Refill    amLODipine (NORVASC) 10 MG tablet Take 1 tablet (10 mg total) by mouth at bedtime. 90 tablet 1   Ascorbic Acid (VITAMIN C PO) Take 1 tablet by mouth daily.     atorvastatin (LIPITOR) 10 MG tablet Take 1 tablet (10 mg total) by mouth at bedtime. 90 tablet 1   b complex vitamins tablet Take 1 tablet by mouth daily.     cholecalciferol (VITAMIN D3) 25 MCG (1000 UNIT) tablet Take 2,000 Units by mouth daily.     Docosahexaenoic Acid (DHA PO) Take 1 tablet by mouth daily.     escitalopram (LEXAPRO) 10 MG tablet Take 1 tablet (10 mg total) by mouth daily. 90 tablet 1   folic acid (FOLVITE) 1 MG tablet Take 2 tablets (2 mg total) by mouth daily. 180 tablet 3   Multiple Vitamin (MULTIVITAMIN WITH MINERALS) TABS tablet Take 1 tablet by mouth daily.     Omega-3 Fatty Acids (FISH OIL PO) Take 1 capsule by mouth daily.     ondansetron (ZOFRAN-ODT) 8 MG disintegrating tablet Take 1 tablet (8 mg total) by mouth every 8 (eight) hours as needed for nausea or vomiting. 20 tablet 0   thiamine (VITAMIN B1) 100 MG tablet Take 1 tablet (100 mg total) by mouth daily. 90 tablet 3   No current facility-administered medications on file prior to visit.   No Known Allergies Family History  Problem Relation Age of Onset   Cancer Father        ?liver   Colon cancer Neg Hx    Breast cancer Neg Hx    Prostate cancer Neg Hx    Heart attack Neg Hx    Diabetes Neg Hx    Thyroid disease Neg Hx    PE: There were no vitals taken for this visit. Wt Readings from Last 3 Encounters:  08/26/22 112 lb (50.8 kg)  05/27/22 107 lb 4 oz (48.6 kg)  05/20/22 110 lb 6.4 oz (50.1 kg)   Constitutional: underweight, in NAD Eyes: no exophthalmos, no lid lag, no stare ENT: no thyromegaly,  no cervical lymphadenopathy Cardiovascular: RRR, No MRG Respiratory: CTA B Musculoskeletal: no deformities Skin:  no rashes Neurological: no tremor with outstretched hands  ASSESSMENT: 1. Thyrotoxicosis  PLAN:  1. Patient with a  history of thyrotoxicosis, probably mild Graves' disease, with normal TSI antibodies.  He was on low-dose methimazole, 5 mg 3 times a week, tolerating well. -At our visit from 11/2021,  his TFTs were at goal so I advised him to stop methimazole.  However, unfortunately, he did not return for labs afterwards.  We did discuss at that time that we always need to check his thyroid test after any methimazole dose change. -At last visit, TSH was slightly low, at 0.2, while free T4 was close to the lower limit of the normal range and free T3 was mid normal range.  I advised him to continue without methimazole, but strongly advised him to return for labs in approximately 1 to 1.5 months.  He returned in 08/2022 and at that time TSH was slightly lower, at 0.14.  However, free T4 was also low and free T3 was normal so I advised him to continue without methimazole at that time pending a recheck at today's visit.  I did advise him to stay off biotin before today's visit. -At today's visit, he has no signs or symptoms of thyrotoxicosis: No palpitations, increased heart rate, tremors, heat intolerance, and his anxiety is better.  No weight loss since last visit. -We will recheck his TFTs today and see if we need to restart methimazole -No need for beta-blockers today as he does not have tremors or palpitations -He had no active signs of Graves' ophthalmopathy: No double vision, blurry vision, eye pain, chemosis -I will see him back in 6 months but may need labs in 1 to 1-1/2 months.  Will need refills of methimazole if we restart it.  Carlus Pavlov, MD PhD [facility] Endocrinology

## 2023-02-27 ENCOUNTER — Encounter: Payer: Self-pay | Admitting: Internal Medicine

## 2023-02-27 ENCOUNTER — Telehealth: Payer: Self-pay | Admitting: Neurology

## 2023-02-27 ENCOUNTER — Ambulatory Visit: Payer: Managed Care, Other (non HMO) | Admitting: Internal Medicine

## 2023-02-27 ENCOUNTER — Ambulatory Visit (INDEPENDENT_AMBULATORY_CARE_PROVIDER_SITE_OTHER): Payer: Managed Care, Other (non HMO)

## 2023-02-27 VITALS — BP 132/80 | HR 86 | Temp 97.7°F | Resp 16 | Ht 68.0 in | Wt 110.4 lb

## 2023-02-27 DIAGNOSIS — E785 Hyperlipidemia, unspecified: Secondary | ICD-10-CM | POA: Diagnosis not present

## 2023-02-27 DIAGNOSIS — R7989 Other specified abnormal findings of blood chemistry: Secondary | ICD-10-CM

## 2023-02-27 DIAGNOSIS — R110 Nausea: Secondary | ICD-10-CM

## 2023-02-27 DIAGNOSIS — E0590 Thyrotoxicosis, unspecified without thyrotoxic crisis or storm: Secondary | ICD-10-CM | POA: Diagnosis not present

## 2023-02-27 DIAGNOSIS — E876 Hypokalemia: Secondary | ICD-10-CM

## 2023-02-27 DIAGNOSIS — I10 Essential (primary) hypertension: Secondary | ICD-10-CM

## 2023-02-27 LAB — CBC WITH DIFFERENTIAL/PLATELET
Basophils Absolute: 0 10*3/uL (ref 0.0–0.1)
Basophils Relative: 0.7 % (ref 0.0–3.0)
Eosinophils Absolute: 0 10*3/uL (ref 0.0–0.7)
Eosinophils Relative: 0.2 % (ref 0.0–5.0)
HCT: 37.7 % — ABNORMAL LOW (ref 39.0–52.0)
Hemoglobin: 12.8 g/dL — ABNORMAL LOW (ref 13.0–17.0)
Lymphocytes Relative: 27.6 % (ref 12.0–46.0)
Lymphs Abs: 0.9 10*3/uL (ref 0.7–4.0)
MCHC: 33.9 g/dL (ref 30.0–36.0)
MCV: 92.7 fL (ref 78.0–100.0)
Monocytes Absolute: 0.2 10*3/uL (ref 0.1–1.0)
Monocytes Relative: 5.1 % (ref 3.0–12.0)
Neutro Abs: 2.2 10*3/uL (ref 1.4–7.7)
Neutrophils Relative %: 66.4 % (ref 43.0–77.0)
Platelets: 240 10*3/uL (ref 150.0–400.0)
RBC: 4.07 Mil/uL — ABNORMAL LOW (ref 4.22–5.81)
RDW: 14.6 % (ref 11.5–15.5)
WBC: 3.4 10*3/uL — ABNORMAL LOW (ref 4.0–10.5)

## 2023-02-27 LAB — LIPASE: Lipase: 56 U/L (ref 11.0–59.0)

## 2023-02-27 LAB — AMYLASE: Amylase: 34 U/L (ref 27–131)

## 2023-02-27 LAB — ALT: ALT: 51 U/L (ref 0–53)

## 2023-02-27 LAB — BASIC METABOLIC PANEL
BUN: 11 mg/dL (ref 6–23)
CO2: 30 meq/L (ref 19–32)
Calcium: 8.6 mg/dL (ref 8.4–10.5)
Chloride: 94 meq/L — ABNORMAL LOW (ref 96–112)
Creatinine, Ser: 0.53 mg/dL (ref 0.40–1.50)
GFR: 112.37 mL/min (ref >60.00)
Glucose, Bld: 86 mg/dL (ref 70–99)
Potassium: 2.8 meq/L — CL (ref 3.5–5.1)
Sodium: 141 meq/L (ref 135–145)

## 2023-02-27 LAB — AST: AST: 154 U/L — ABNORMAL HIGH (ref 0–37)

## 2023-02-27 MED ORDER — POTASSIUM CHLORIDE CRYS ER 10 MEQ PO TBCR: 10.0000 meq | EXTENDED_RELEASE_TABLET | Freq: Every day | ORAL | 0 refills | Status: DC

## 2023-02-27 NOTE — Telephone Encounter (Signed)
 Hypokalemia noted, not on diuretics to my knowledge, he reported some  nausea but no diarrhea.  BP is well-controlled with amlodipine . AST>>ALT, history of EtOH.  -Add amylase and lipase if possible.  Dx increased LFTs - Send  KCl 10 mEq 1 p.o. daily, #30 no refill. -CMP in 2 weeks. - Advise LFTs are high, probably from alcohol.  Will recheck in 2 weeks  Call  patient with above information.

## 2023-02-27 NOTE — Addendum Note (Signed)
 Addended by: Brysun Eschmann D on: 02/27/2023 03:08 PM   Modules accepted: Orders

## 2023-02-27 NOTE — Progress Notes (Signed)
 Subjective:    Patient ID: Hunter Mitchell, male    DOB: 05/20/1965, 58 y.o.   MRN: [id number]  DOS:  02/27/2023 Type of visit - description: Follow-up  Here for follow-up. Chronic medical problems addressed. Reports nausea for the last few days, mild symptoms. Denies vomiting, change in the color of the stools, fever or abdominal pain;  occasionally has diarrhea. Symptoms are not worse by eating.  No headaches.   Wt Readings from Last 3 Encounters:  02/27/23 110 lb 6 oz (50.1 kg)  08/26/22 112 lb (50.8 kg)  05/27/22 107 lb 4 oz (48.6 kg)    Review of Systems See above   Past Medical History:  Diagnosis Date   Anxiety    Hypertension    Syncope 11/17/2009   ECHO 01-2010 normal   Thyroid  disease     Past Surgical History:  Procedure Laterality Date   NO PAST SURGERIES      Current Outpatient Medications  Medication Instructions   amLODipine  (NORVASC ) 10 mg, Oral, Daily at bedtime   Ascorbic Acid (VITAMIN C PO) 1 tablet, Daily   atorvastatin  (LIPITOR) 10 mg, Oral, Daily at bedtime   b complex vitamins tablet 1 tablet, Daily   cholecalciferol (VITAMIN D3) 2,000 Units, Daily   Docosahexaenoic Acid (DHA PO) 1 tablet, Daily   escitalopram  (LEXAPRO ) 10 mg, Oral, Daily   folic acid  (FOLVITE ) 2 mg, Oral, Daily   Multiple Vitamin (MULTIVITAMIN WITH MINERALS) TABS tablet 1 tablet, Daily   Omega-3 Fatty Acids (FISH OIL PO) 1 capsule, Daily   ondansetron  (ZOFRAN -ODT) 8 mg, Oral, Every 8 hours PRN   thiamine  (VITAMIN B1) 100 mg, Oral, Daily       Objective:   Physical Exam BP 132/80   Pulse 86   Temp 97.7 F (36.5 C) (Oral)   Resp 16   Ht 5\' 8"  (1.727 m)   Wt 110 lb 6 oz (50.1 kg)   SpO2 96%   BMI 16.78 kg/m  General:   Well developed, NAD, BMI noted.  HEENT:  Normocephalic . Face symmetric, atraumatic Lungs:  CTA B Normal respiratory effort, no intercostal retractions, no accessory muscle use. Heart: RRR,  no murmur.  Abdomen:  Not distended, soft,  non-tender. No rebound or rigidity.   Skin: Not pale. Not jaundice Lower extremities: no pretibial edema bilaterally  Neurologic:  alert & oriented X3.  Speech normal, gait appropriate for age and unassisted Psych--  Cognition and judgment appear intact.  Cooperative with normal attention span and concentration.  Behavior appropriate. No anxious or depressed appearing.     Assessment     ASSESSMENT Hypertensive emergency 06/2018 Dyslipidemia Hyperthyroidism, per endo Anxiety (started lexapro  06/2018) CV: --Syncope, normal echo 2012 --Syncope 06/2018, work-up negative Liver: ---Alcohol abuse DX 06/2018 ---Hep B: Labs on 7-20 20: HBsAb+, HBsAg- consistent with immunity. The HBV viral load (DNA) was undetectable Vitamin deficiencies: Vitamin D  (07-2018), folic acid  and thiamine  (Dx 2023)  PLAN: HTN:BP looks very good, encouraged to check ambulatory BPs, continue amlodipine , check BMP High cholesterol: Total cholesterol 215 May 2024, CV RF increased, started atorvastatin , follow-up total cholesterol 183.  No change.  Check LFTs. Hyperthyroidism on no meds, over due to see endo-, referral sent. EtOH: Still drinks daily, "1 or 2 glasses of wine at night". H/o weight loss: Weight essentially stable. Nausea: As described above, abdominal exam is benign, recommend observation for now. avoid NSAIDs, checking a CBC. Preventive care. Has consistently declined any vaccines Lung cancer screening: Explained with patient today  process and benefits.  Will let me know if interested RTC 3-4 months, CPX

## 2023-02-27 NOTE — Telephone Encounter (Signed)
 Tried calling Pt- no answer, unable to leave message.   Will try sending mychart message. If Pt calls back okay for e2c2 to relay.  Rx sent. Amylase, lipase faxed to [facility]. CMP ordered.

## 2023-02-27 NOTE — Assessment & Plan Note (Signed)
 HTN:BP looks very good, encouraged to check ambulatory BPs, continue amlodipine , check BMP High cholesterol: Total cholesterol 215 May 2024, CV RF increased, started atorvastatin , follow-up total cholesterol 183.  No change.  Check LFTs. Hyperthyroidism on no meds, over due to see endo-, referral sent. EtOH: Still drinks daily, "1 or 2 glasses of wine at night". H/o weight loss: Weight essentially stable. Nausea: As described above, abdominal exam is benign, recommend observation for now. avoid NSAIDs, checking a CBC. Preventive care. Has consistently declined any vaccines Lung cancer screening: Explained with patient today process and benefits.  Will let me know if interested RTC 3-4 months, CPX

## 2023-02-27 NOTE — Telephone Encounter (Signed)
 CRITICAL VALUE STICKER  CRITICAL VALUE: Potassium 2.8  RECEIVER (on-site recipient of call): Hunter Mitchell  DATE & TIME NOTIFIED: 02/26/2022 2:43 pm  MESSENGER (representative from lab): Hope

## 2023-02-27 NOTE — Telephone Encounter (Signed)
 Tried calling Pt- phone number did not ring. I'll try again tomorrow.

## 2023-02-27 NOTE — Patient Instructions (Addendum)
  Check the  blood pressure regularly Blood pressure goal:  between 110/65 and  135/85. If it is consistently higher or lower, let me know     GO TO THE LAB : Get the blood work     Next visit with me in 3 to 4 months for a physical exam    Please schedule it at the front desk

## 2023-02-28 NOTE — Telephone Encounter (Signed)
Tried calling Pt again today, call is answered but no one says anything.

## 2023-02-28 NOTE — Telephone Encounter (Signed)
We have call, send a MyChart message and a letter.

## 2023-04-07 ENCOUNTER — Encounter: Payer: Self-pay | Admitting: Internal Medicine

## 2023-04-26 ENCOUNTER — Other Ambulatory Visit: Payer: Self-pay | Admitting: Internal Medicine

## 2023-05-24 ENCOUNTER — Encounter (HOSPITAL_COMMUNITY): Payer: Self-pay

## 2023-05-31 ENCOUNTER — Encounter: Payer: Managed Care, Other (non HMO) | Admitting: Internal Medicine

## 2023-06-20 ENCOUNTER — Encounter: Admitting: Internal Medicine

## 2023-06-20 ENCOUNTER — Encounter: Payer: Self-pay | Admitting: Internal Medicine

## 2023-06-27 ENCOUNTER — Ambulatory Visit (INDEPENDENT_AMBULATORY_CARE_PROVIDER_SITE_OTHER): Admitting: Internal Medicine

## 2023-06-27 ENCOUNTER — Encounter: Payer: Self-pay | Admitting: Internal Medicine

## 2023-06-27 VITALS — BP 136/82 | HR 91 | Temp 98.2°F | Resp 16 | Ht 68.0 in | Wt 110.1 lb

## 2023-06-27 DIAGNOSIS — Z0000 Encounter for general adult medical examination without abnormal findings: Secondary | ICD-10-CM

## 2023-06-27 DIAGNOSIS — E785 Hyperlipidemia, unspecified: Secondary | ICD-10-CM

## 2023-06-27 DIAGNOSIS — E559 Vitamin D deficiency, unspecified: Secondary | ICD-10-CM

## 2023-06-27 DIAGNOSIS — Z0001 Encounter for general adult medical examination with abnormal findings: Secondary | ICD-10-CM | POA: Diagnosis not present

## 2023-06-27 DIAGNOSIS — I10 Essential (primary) hypertension: Secondary | ICD-10-CM | POA: Diagnosis not present

## 2023-06-27 DIAGNOSIS — E519 Thiamine deficiency, unspecified: Secondary | ICD-10-CM | POA: Diagnosis not present

## 2023-06-27 DIAGNOSIS — F1010 Alcohol abuse, uncomplicated: Secondary | ICD-10-CM

## 2023-06-27 MED ORDER — ESCITALOPRAM OXALATE 10 MG PO TABS: 10.0000 mg | ORAL_TABLET | Freq: Every day | ORAL | 1 refills | Status: AC

## 2023-06-27 NOTE — Progress Notes (Unsigned)
 Subjective:    Patient ID: Hunter Mitchell, male    DOB: Oct 08, 1965, 58 y.o.   MRN: [id number]  DOS:  06/27/2023 Type of visit - description: CPX  Here for CPX. Chronic medical problems discussed. Currently feeling well has no symptoms or concerns Wt Readings from Last 3 Encounters:  06/27/23 110 lb 2 oz (50 kg)  02/27/23 110 lb 6 oz (50.1 kg)  08/26/22 112 lb (50.8 kg)     Review of Systems See above   Past Medical History:  Diagnosis Date   Anxiety    Hypertension    Syncope 11/17/2009   ECHO 01-2010 normal   Thyroid  disease     Past Surgical History:  Procedure Laterality Date   NO PAST SURGERIES      Current Outpatient Medications  Medication Instructions   amLODipine  (NORVASC ) 10 mg, Oral, Daily at bedtime   Ascorbic Acid (VITAMIN C PO) 1 tablet, Daily   atorvastatin  (LIPITOR) 10 mg, Oral, Daily at bedtime   b complex vitamins tablet 1 tablet, Daily   cholecalciferol (VITAMIN D3) 2,000 Units, Daily   Docosahexaenoic Acid (DHA PO) 1 tablet, Daily   escitalopram  (LEXAPRO ) 10 mg, Oral, Daily   folic acid  (FOLVITE ) 2 mg, Oral, Daily   Multiple Vitamin (MULTIVITAMIN WITH MINERALS) TABS tablet 1 tablet, Daily   Omega-3 Fatty Acids (FISH OIL PO) 1 capsule, Daily   ondansetron  (ZOFRAN -ODT) 8 mg, Oral, Every 8 hours PRN   potassium chloride  (KLOR-CON  M) 10 MEQ tablet 10 mEq, Oral, Daily   thiamine  (VITAMIN B1) 100 mg, Oral, Daily       Objective:   Physical Exam BP 136/82   Pulse 91   Temp 98.2 F (36.8 C) (Oral)   Resp 16   Ht 5\' 8"  (1.727 m)   Wt 110 lb 2 oz (50 kg)   SpO2 93%   BMI 16.74 kg/m  General: Well developed, NAD, BMI noted.  O2 sat upon arrival 93%, recheck 98% Neck: No  thyromegaly  HEENT:  Normocephalic . Face symmetric, atraumatic Lungs:  CTA B Normal respiratory effort, no intercostal retractions, no accessory muscle use. Heart: RRR,  no murmur.  Abdomen:  Not distended, soft, non-tender. No rebound or rigidity.   Lower  extremities: no pretibial edema bilaterally  Skin: Exposed areas without rash. Not pale. Not jaundice Neurologic:  alert & oriented X3.  Speech normal, gait appropriate for age and unassisted Strength symmetric and appropriate for age.  Psych: Cognition and judgment appear intact.  Cooperative with normal attention span and concentration.  Behavior appropriate. No anxious or depressed appearing.     Assessment     ASSESSMENT Hypertensive emergency 06/2018 Dyslipidemia Hyperthyroidism, per endo Anxiety (started lexapro  06/2018) CV: --Syncope, normal echo 2012 --Syncope 06/2018, work-up negative Liver: ---Alcohol abuse DX 06/2018 ---Hep B: Labs on 7-20 20: HBsAb+, HBsAg- consistent with immunity. The HBV viral load (DNA) was undetectable Vitamin deficiencies: Vitamin D  (07-2018), folic acid  and thiamine  (Dx 2023)  PLAN: Here for CPX - Td  03/30/2016 -Vaccines I recommend: Shingrix, COVID-vaccine, PNM 20 (alcohol abuse), flu shot every fall. --CCS: Cologuard NEG 07-2021 --Prostate cancer screening: No symptoms, check check PSA --Labs:CMP FLP CBC vitamin D  PSA thiamine  -Diet and exercise discussed   -Lung cancer screening: Patient clarified to me today that he quit tobacco  in 2001, not 2021,thus does not qualify for screening.    HTN: BP looks good, recommend to check at home, continue amlodipine . Dyslipidemia: On atorvastatin , checking FLP Hypothyroidism: Last TSH is  slightly suppressed, has not seen Endo, encouraged again to do it. Anxiety: Controlled, RF Lexapro  Alcohol abuse: States she is doing the same, "only drink 1 or 2 glasses of wine at night". Vitamin D  deficiency, thiamine  deficiency: Checking levels.  Encouraged supplements. Hypokalemia: Noted on 02/27/2023, AST elevated, possibly from EtOH. Amylase lipase negative.  Was rec to recheck blood but he did not come back.  Rechecking today. Mild anemia: Noted per chart review, no GI symptoms, check a CBC RTC 6  months    2/25 HTN:BP looks very good, encouraged to check ambulatory BPs, continue amlodipine , check BMP High cholesterol: Total cholesterol 215 May 2024, CV RF increased, started atorvastatin , follow-up total cholesterol 183.  No change.  Check LFTs. Hyperthyroidism on no meds, over due to see endo-, referral sent. EtOH: Still drinks daily, "1 or 2 glasses of wine at night". H/o weight loss: Weight essentially stable. Nausea: As described above, abdominal exam is benign, recommend observation for now. avoid NSAIDs, checking a CBC. Preventive care. Has consistently declined any vaccines Lung cancer screening: Explained with patient today process and benefits.  Will let me know if interested RTC 3-4 months, CPX

## 2023-06-27 NOTE — Patient Instructions (Addendum)
 Vaccines I recommend: Pneumonia shot (PNM 20) Flu shot every fall Shingrix COVID-vaccine  I recommend you to see Dr. Aldona Amel for a checkup on your thyroid .  Be sure you take vitamin D  over-the-counter: 2000 units daily  Take a B complex vitamin every day.   Check the  blood pressure regularly Blood pressure goal:  between 110/65 and  135/85. If it is consistently higher or lower, let me know     GO TO THE LAB :  Get the blood work   Your results will be posted on MyChart with my comments  Next office visit for a checkup in 6 months please make an appointment before you leave today

## 2023-06-29 ENCOUNTER — Encounter: Payer: Self-pay | Admitting: Internal Medicine

## 2023-06-29 NOTE — Assessment & Plan Note (Signed)
 Here for CPX - Td  03/30/2016 -Vaccines I recommend: Shingrix, COVID-vaccine, PNM 20 (alcohol abuse), flu shot every fall. --CCS: Cologuard NEG 07-2021 --Prostate cancer screening: No symptoms, check check PSA --Labs:CMP FLP CBC vitamin D  PSA thiamine  -Diet and exercise discussed -Lung cancer screening: Patient clarified to me today that he quit tobacco  in 2001, not 2021,thus does not qualify for screening.

## 2023-06-29 NOTE — Assessment & Plan Note (Signed)
 Here for CPX  Other issues addressed HTN: BP looks good, recommend to check at home, continue amlodipine . Dyslipidemia: On atorvastatin , checking FLP Hyperthyroidism: Last TSH is slightly suppressed, has not seen Endo, encouraged again to do. Anxiety: Controlled, RF Lexapro  Alcohol abuse: States she is doing the same, only drink 1 or 2 glasses of wine at night. Vitamin D  deficiency, thiamine  deficiency: Checking levels.  Encouraged supplements. Hypokalemia, elevated AST: Noted on 02/27/2023, possibly from EtOH. Amylase lipase negative.  Was rec to recheck blood but he did not come back.  Rechecking today. Mild anemia: Noted per chart review, no GI symptoms, check a CBC RTC 6 months

## 2023-07-01 LAB — COMPREHENSIVE METABOLIC PANEL WITH GFR
AG Ratio: 1.5 (calc) (ref 1.0–2.5)
ALT: 41 U/L (ref 9–46)
AST: 111 U/L — ABNORMAL HIGH (ref 10–35)
Albumin: 4.7 g/dL (ref 3.6–5.1)
Alkaline phosphatase (APISO): 91 U/L (ref 35–144)
BUN/Creatinine Ratio: 14 (calc) (ref 6–22)
BUN: 9 mg/dL (ref 7–25)
CO2: 31 mmol/L (ref 20–32)
Calcium: 9.5 mg/dL (ref 8.6–10.3)
Chloride: 95 mmol/L — ABNORMAL LOW (ref 98–110)
Creat: 0.64 mg/dL — ABNORMAL LOW (ref 0.70–1.30)
Globulin: 3.1 g/dL (ref 1.9–3.7)
Glucose, Bld: 96 mg/dL (ref 65–99)
Potassium: 3.1 mmol/L — ABNORMAL LOW (ref 3.5–5.3)
Sodium: 140 mmol/L (ref 135–146)
Total Bilirubin: 0.9 mg/dL (ref 0.2–1.2)
Total Protein: 7.8 g/dL (ref 6.1–8.1)
eGFR: 111 mL/min/{1.73_m2} (ref >=60)

## 2023-07-01 LAB — LIPID PANEL
Cholesterol: 271 mg/dL — ABNORMAL HIGH (ref <200)
HDL: >200 mg/dL (ref >=40)
Triglycerides: 75 mg/dL (ref <150)

## 2023-07-01 LAB — VITAMIN D 25 HYDROXY (VIT D DEFICIENCY, FRACTURES): Vit D, 25-Hydroxy: 33 ng/mL (ref 30–100)

## 2023-07-01 LAB — PSA: PSA: 2.36 ng/mL (ref <=4.00)

## 2023-07-01 LAB — VITAMIN B1: Vitamin B1 (Thiamine): 8 nmol/L (ref 8–30)

## 2023-07-01 LAB — EXTRA SPECIMEN

## 2023-07-01 LAB — CBC WITH DIFFERENTIAL/PLATELET

## 2023-07-04 ENCOUNTER — Ambulatory Visit: Payer: Self-pay | Admitting: Internal Medicine

## 2023-07-04 ENCOUNTER — Telehealth: Payer: Self-pay | Admitting: Internal Medicine

## 2023-07-04 DIAGNOSIS — E876 Hypokalemia: Secondary | ICD-10-CM

## 2023-07-04 NOTE — Addendum Note (Signed)
 Addended by: Marigene Shoulder on: 07/04/2023 09:22 AM   Modules accepted: Orders

## 2023-07-04 NOTE — Telephone Encounter (Signed)
 Noted

## 2023-07-04 NOTE — Telephone Encounter (Signed)
 Called pt and let him know to schedule an appointment with a lab to redraw for CBC. [facility] said they never received specimen

## 2023-07-05 NOTE — Telephone Encounter (Signed)
-   Potassium is still low.  On amlodipine  only. - Cholesterol increased to 271, previously well-controlled on atorvastatin  10 mg. - PSA stable -CBC pending -AST quite elevated, ALT WNL.  History of EtOH Please call patient: Potassium remains low, recommend the following labs draw early in the morning -BMP -Plasma aldosterone concentration -Plasma renin activity Further advised for results Cholesterol high, stay on atorvastatin  10 mg, be sure to take it every day.

## 2023-07-05 NOTE — Telephone Encounter (Signed)
 LMOM informing Pt of results and recommendations. Asked Pt to call back to schedule lab appt at his earliest convenience- early morning. Orders placed.

## 2023-07-17 ENCOUNTER — Other Ambulatory Visit: Payer: Self-pay | Admitting: Internal Medicine

## 2023-08-16 ENCOUNTER — Ambulatory Visit (HOSPITAL_COMMUNITY)
Admission: EM | Admit: 2023-08-16 | Discharge: 2023-08-16 | Disposition: A | Discharge status: Home / Self Care | Attending: Family Medicine | Admitting: Family Medicine

## 2023-08-16 ENCOUNTER — Encounter (HOSPITAL_COMMUNITY): Payer: Self-pay

## 2023-08-16 DIAGNOSIS — R1110 Vomiting, unspecified: Secondary | ICD-10-CM | POA: Diagnosis not present

## 2023-08-16 DIAGNOSIS — R197 Diarrhea, unspecified: Secondary | ICD-10-CM | POA: Diagnosis not present

## 2023-08-16 DIAGNOSIS — E860 Dehydration: Secondary | ICD-10-CM | POA: Diagnosis not present

## 2023-08-16 LAB — POCT URINALYSIS DIP (MANUAL ENTRY)
Blood, UA: NEGATIVE
Glucose, UA: NEGATIVE mg/dL
Leukocytes, UA: NEGATIVE
Nitrite, UA: NEGATIVE
Protein Ur, POC: >=300 mg/dL — AB
Spec Grav, UA: >=1.03 — AB (ref 1.010–1.025)
Urobilinogen, UA: 1 U/dL
pH, UA: 6 (ref 5.0–8.0)

## 2023-08-16 LAB — POCT FASTING CBG KUC MANUAL ENTRY: POCT Glucose (KUC): 89 mg/dL (ref 70–99)

## 2023-08-16 MED ORDER — SODIUM CHLORIDE 0.9 % IV BOLUS
1000.0000 mL | Freq: Once | INTRAVENOUS | Status: AC
  Administered 2023-08-16: 1000 mL via INTRAVENOUS

## 2023-08-16 MED ORDER — ONDANSETRON 4 MG PO TBDP: 4.0000 mg | ORAL_TABLET | Freq: Three times a day (TID) | ORAL | 0 refills | Status: AC | PRN

## 2023-08-16 MED ORDER — ONDANSETRON HCL 4 MG/2ML IJ SOLN
INTRAMUSCULAR | Status: AC
Start: 2023-08-16 — End: 2023-08-16
  Filled 2023-08-16: qty 2

## 2023-08-16 MED ORDER — ONDANSETRON HCL 4 MG/2ML IJ SOLN
4.0000 mg | Freq: Once | INTRAMUSCULAR | Status: AC
  Administered 2023-08-16: 4 mg via INTRAVENOUS

## 2023-08-16 NOTE — ED Triage Notes (Signed)
 Pt presents with nausea, vomiting, and diarrhea. Symptoms started Monday.  Pt has taken OTC medication.

## 2023-08-16 NOTE — Discharge Instructions (Signed)
Please do your best to ensure adequate fluid intake in order to avoid dehydration. If you find that you are unable to tolerate drinking fluids regularly please proceed to the Emergency Department for evaluation. ° ° °

## 2023-08-17 NOTE — ED Provider Notes (Signed)
 [facility]   [id number] 08/16/23 Arrival Time: 1341  ASSESSMENT & PLAN:  1. Vomiting and diarrhea   2. Dehydration    Feels better after 1L NS.  Meds ordered this encounter  Medications   sodium chloride  0.9 % bolus 1,000 mL   ondansetron  (ZOFRAN ) injection 4 mg   ondansetron  (ZOFRAN -ODT) 4 MG disintegrating tablet    Sig: Take 1 tablet (4 mg total) by mouth every 8 (eight) hours as needed for nausea or vomiting.    Dispense:  15 tablet    Refill:  0    Discussed typical duration of symptoms for suspected viral GI illness. Will do his best to ensure adequate fluid intake in order to avoid dehydration. Will proceed to the Emergency Department for evaluation if unable to tolerate PO fluids regularly.  Otherwise he will f/u with his PCP or here if not showing improvement over the next 48-72 hours.  Reviewed expectations re: course of current medical issues. Questions answered. Outlined signs and symptoms indicating need for more acute intervention. Patient verbalized understanding. After Visit Summary given.   SUBJECTIVE: History from: patient.  Hunter Mitchell is a 58 y.o. male who presents with complaint of non-bilious, non-bloody intermittent n/v with non-bloody diarrhea. Onset abrupt; x 3 days; is easing but feels very fatigued and weak. Abdominal discomfort: mild and cramping. Appetite: decreased. PO intake: decreased. Ambulatory without assistance. Urinary symptoms: denies. Sick contacts: none. Recent travel or camping: none. OTC treatment: none.   Past Surgical History:  Procedure Laterality Date   NO PAST SURGERIES       OBJECTIVE:  Vitals:   08/16/23 1410  BP: 129/76  Pulse: 86  Resp: 20  Temp: 98.3 F (36.8 C)  TempSrc: Oral  SpO2: 98%    General appearance: alert; no distress Oropharynx: dry Lungs: clear to auscultation bilaterally; unlabored Heart: regular rate and rhythm Abdomen: soft; non-distended; no significant abdominal  tenderness; reports cramping feeling; bowel sounds present; no masses or organomegaly; no guarding or rebound tenderness Back: no CVA tenderness Extremities: no edema; symmetrical with no gross deformities Skin: warm; dry Neurologic: normal gait Psychological: alert and cooperative; normal mood and affect  Labs: Results for orders placed or performed during the hospital encounter of 08/16/23  POC urinalysis dipstick   Collection Time: 08/16/23  3:42 PM  Result Value Ref Range   Color, UA orange (A) yellow   Clarity, UA clear clear   Glucose, UA negative negative mg/dL   Bilirubin, UA large (A) negative   Ketones, POC UA >= (160) (A) negative mg/dL   Spec Grav, UA >=8.969 (A) 1.010 - 1.025   Blood, UA negative negative   pH, UA 6.0 5.0 - 8.0   Protein Ur, POC >=300 (A) negative mg/dL   Urobilinogen, UA 1.0 0.2 or 1.0 E.U./dL   Nitrite, UA Negative Negative   Leukocytes, UA Negative Negative  POC CBG monitoring   Collection Time: 08/16/23  4:38 PM  Result Value Ref Range   POCT Glucose ([facility]) 89 70 - 99 mg/dL   Labs Reviewed  POCT URINALYSIS DIP (MANUAL ENTRY) - Abnormal; Notable for the following components:      Result Value   Color, UA orange (*)    Bilirubin, UA large (*)    Ketones, POC UA >= (160) (*)    Spec Grav, UA >=1.030 (*)    Protein Ur, POC >=300 (*)    All other components within normal limits  POCT FASTING CBG [facility] MANUAL ENTRY  Imaging: No results found.  No Known Allergies                                             Past Medical History:  Diagnosis Date   Anxiety    Hypertension    Syncope 11/17/2009   ECHO 01-2010 normal   Thyroid  disease    Social History   Socioeconomic History   Marital status: Married    Spouse name: Not on file   Number of children: 3   Years of education: Not on file   Highest education level: Not on file  Occupational History   Occupation: [employer]  Tobacco Use   Smoking status: Former    Current packs/day:  0.00    Average packs/day: 1 pack/day for 35.0 years (35.0 ttl pk-yrs)    Types: Cigarettes    Start date: 68    Quit date: 2021    Years since quitting: 4.5   Smokeless tobacco: Never   Tobacco comments:    1 ppd from 1986 to 2021  Vaping Use   Vaping status: Never Used  Substance and Sexual Activity   Alcohol use: Yes    Comment: daily   Drug use: No   Sexual activity: Not on file  Other Topics Concern   Not on file  Social History Narrative   Household- pt , wife    Children x 3,biological, 4 step children      Social Drivers of Corporate investment banker Strain: Not on file  Food Insecurity: Not on file  Transportation Needs: Not on file  Physical Activity: Not on file  Stress: Not on file  Social Connections: Not on file  Intimate Partner Violence: Not on file   Family History  Problem Relation Age of Onset   Cancer Father        ?liver   Colon cancer Neg Hx    Breast cancer Neg Hx    Prostate cancer Neg Hx    Heart attack Neg Hx    Diabetes Neg Hx    Thyroid  disease Neg Hx       Rolinda Rogue, MD 08/17/23 [phone]

## 2023-12-26 ENCOUNTER — Ambulatory Visit: Admitting: Internal Medicine

## 2024-02-14 ENCOUNTER — Ambulatory Visit: Admitting: Internal Medicine

## 2024-03-12 ENCOUNTER — Ambulatory Visit: Admitting: Internal Medicine
